# Patient Record
Sex: Female | Born: 1960 | Race: White | Hispanic: No | Marital: Single | Smoking: Current every day smoker
Health system: Southern US, Community
[De-identification: ages and names within clinical notes are randomized; demographics above are authoritative.]

## PROBLEM LIST (undated history)

## (undated) DIAGNOSIS — J449 Chronic obstructive pulmonary disease, unspecified: Secondary | ICD-10-CM

## (undated) DIAGNOSIS — G459 Transient cerebral ischemic attack, unspecified: Secondary | ICD-10-CM

## (undated) DIAGNOSIS — IMO0002 Reserved for concepts with insufficient information to code with codable children: Secondary | ICD-10-CM

## (undated) DIAGNOSIS — I10 Essential (primary) hypertension: Secondary | ICD-10-CM

## (undated) HISTORY — DX: Transient cerebral ischemic attack, unspecified: G45.9

## (undated) HISTORY — PX: BACK SURGERY: SHX140

---

## 2001-12-08 ENCOUNTER — Encounter: Payer: Self-pay | Admitting: Emergency Medicine

## 2001-12-08 ENCOUNTER — Emergency Department (HOSPITAL_COMMUNITY): Admission: EM | Admit: 2001-12-08 | Discharge: 2001-12-08 | Payer: Self-pay | Admitting: Emergency Medicine

## 2002-03-21 ENCOUNTER — Ambulatory Visit (HOSPITAL_COMMUNITY): Admission: RE | Admit: 2002-03-21 | Discharge: 2002-03-21 | Payer: Self-pay | Admitting: Obstetrics and Gynecology

## 2002-03-21 ENCOUNTER — Encounter: Payer: Self-pay | Admitting: Obstetrics and Gynecology

## 2002-04-25 ENCOUNTER — Ambulatory Visit (HOSPITAL_COMMUNITY): Admission: RE | Admit: 2002-04-25 | Discharge: 2002-04-25 | Payer: Self-pay | Admitting: Obstetrics & Gynecology

## 2002-05-12 ENCOUNTER — Emergency Department (HOSPITAL_COMMUNITY): Admission: EM | Admit: 2002-05-12 | Discharge: 2002-05-12 | Payer: Self-pay | Admitting: Emergency Medicine

## 2004-01-15 IMAGING — CR DG CHEST 2V
2 series · 2 of 2 positions shown · non-contrast
Comparison: none

CLINICAL DATA: Chest pain.
 TWO VIEW CHEST
 No prior studies.
 The heart size and mediastinal contours are unremarkable.  The lungs are clear.  The visualized skeleton is unremarkable.  
 IMPRESSION
 No active disease.

[view not recorded (1 of 2)]
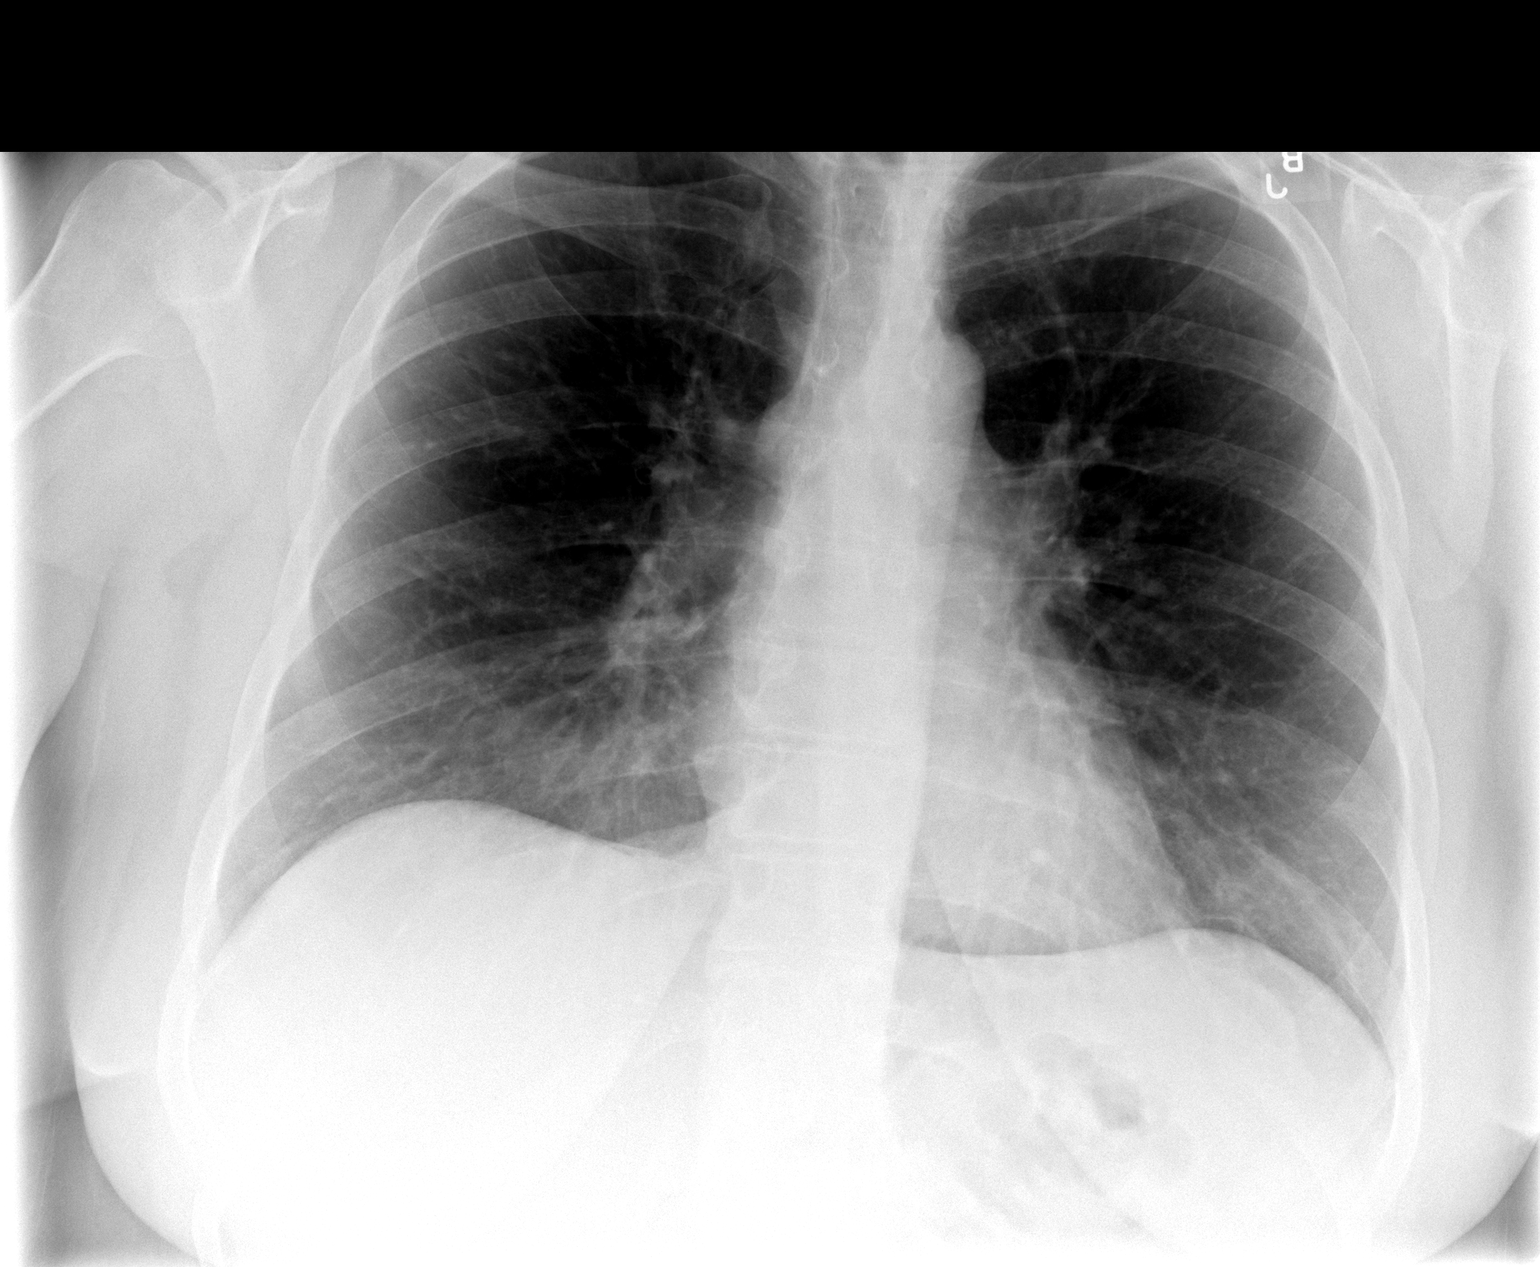

[view not recorded (2 of 2)]
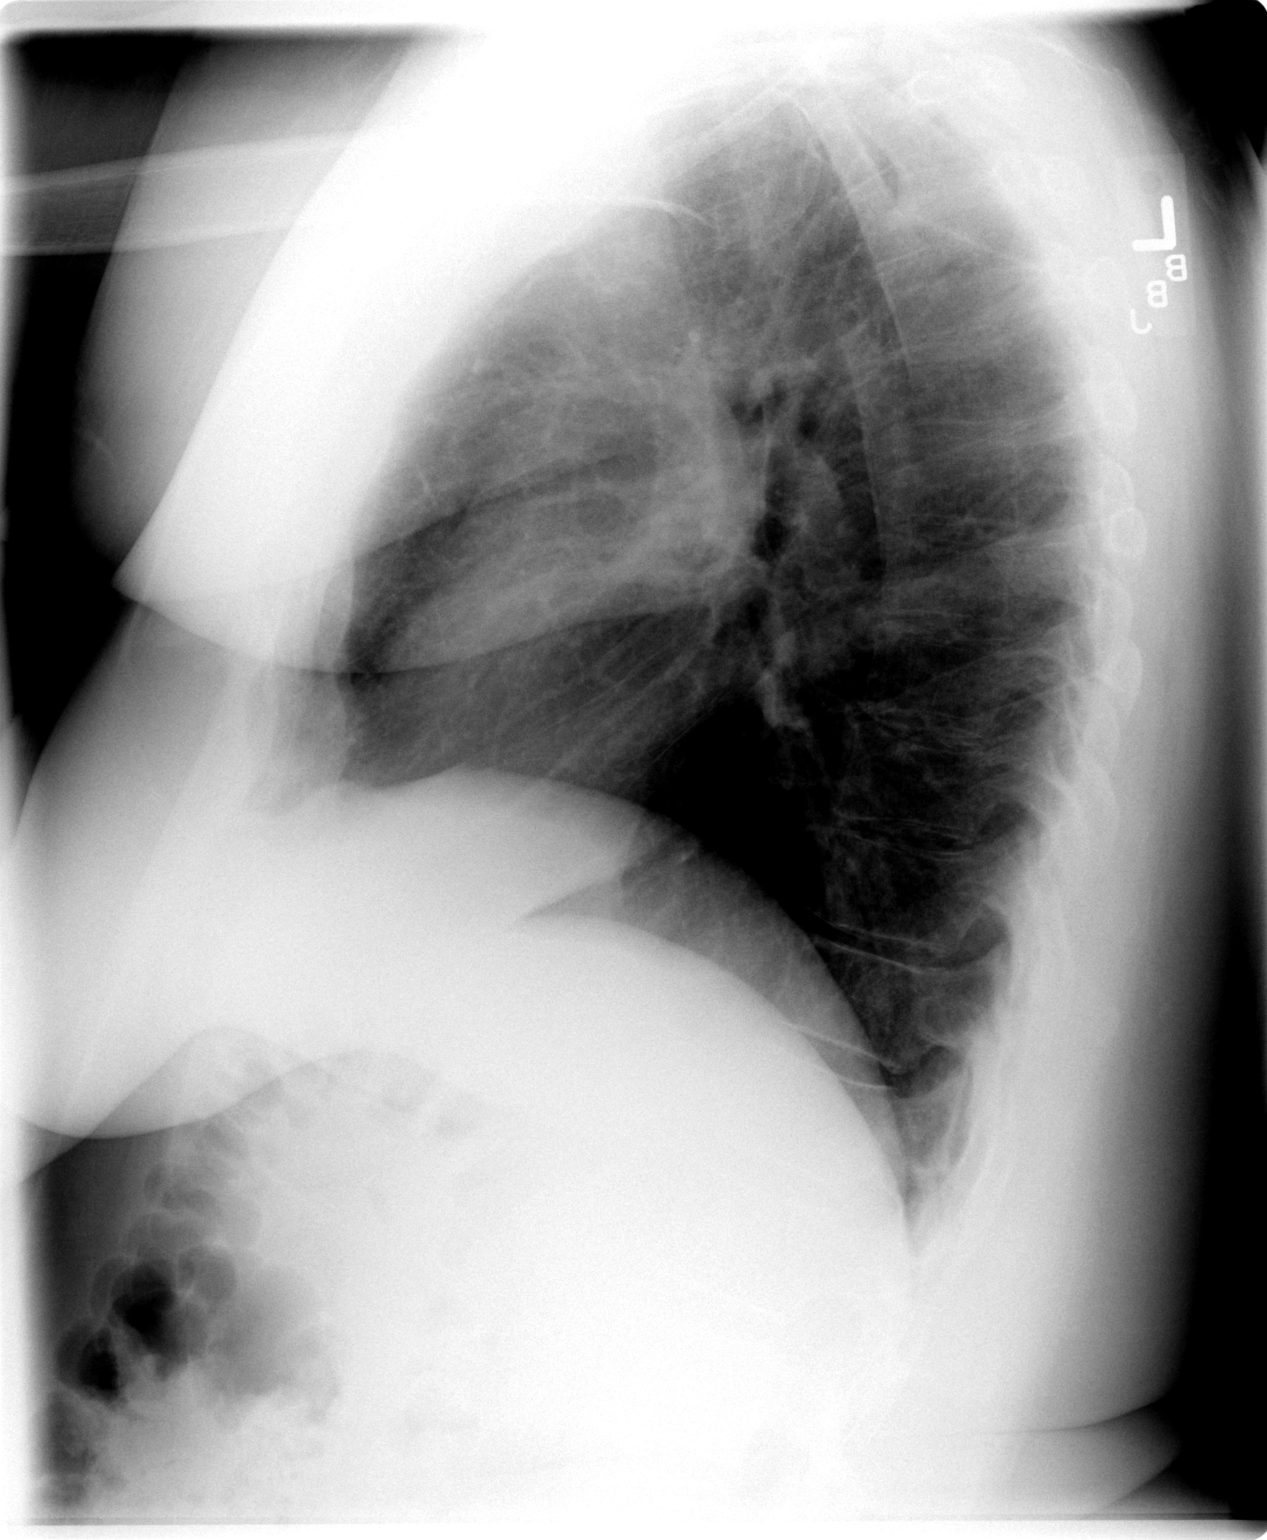

[2 of 2 positions shown; findings below may reference images not displayed]

## 2004-06-19 ENCOUNTER — Emergency Department (HOSPITAL_COMMUNITY): Admission: EM | Admit: 2004-06-19 | Discharge: 2004-06-19 | Payer: Self-pay | Admitting: Emergency Medicine

## 2004-10-06 ENCOUNTER — Ambulatory Visit: Payer: Self-pay | Admitting: Family Medicine

## 2004-11-27 ENCOUNTER — Ambulatory Visit: Payer: Self-pay | Admitting: *Deleted

## 2006-04-21 ENCOUNTER — Ambulatory Visit: Payer: Self-pay | Admitting: Cardiology

## 2006-04-30 ENCOUNTER — Ambulatory Visit: Payer: Self-pay | Admitting: Family Medicine

## 2006-05-07 ENCOUNTER — Ambulatory Visit: Payer: Self-pay | Admitting: Family Medicine

## 2006-06-11 ENCOUNTER — Ambulatory Visit: Payer: Self-pay | Admitting: Family Medicine

## 2007-02-13 ENCOUNTER — Emergency Department (HOSPITAL_COMMUNITY): Admission: EM | Admit: 2007-02-13 | Discharge: 2007-02-13 | Payer: Self-pay | Admitting: Emergency Medicine

## 2007-02-13 IMAGING — CR DG LUMBAR SPINE COMPLETE 4+V
5 series · 5 of 5 positions shown · non-contrast
Comparison: None.

CLINICAL DATA: Low back and left pelvic pain after bending over. 
 DIAGNOSTIC LUMBAR SPINE ? 4 VIEW:

[view not recorded (1 of 5)]
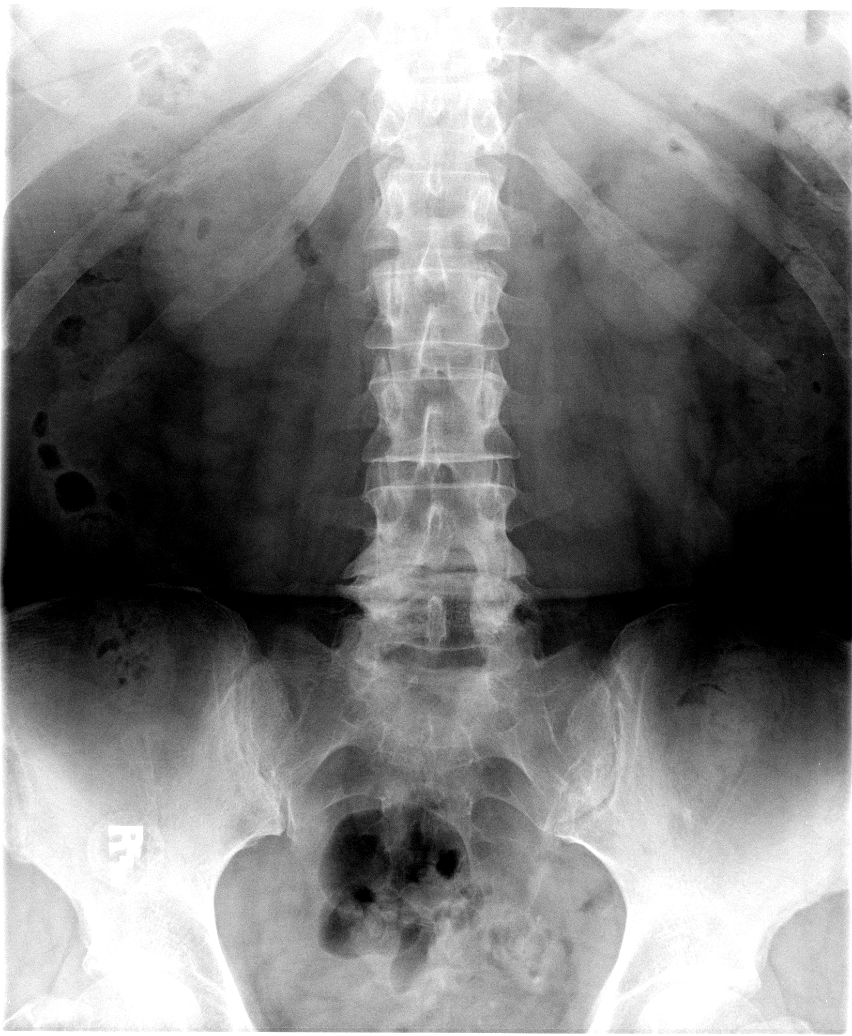

[view not recorded (2 of 5)]
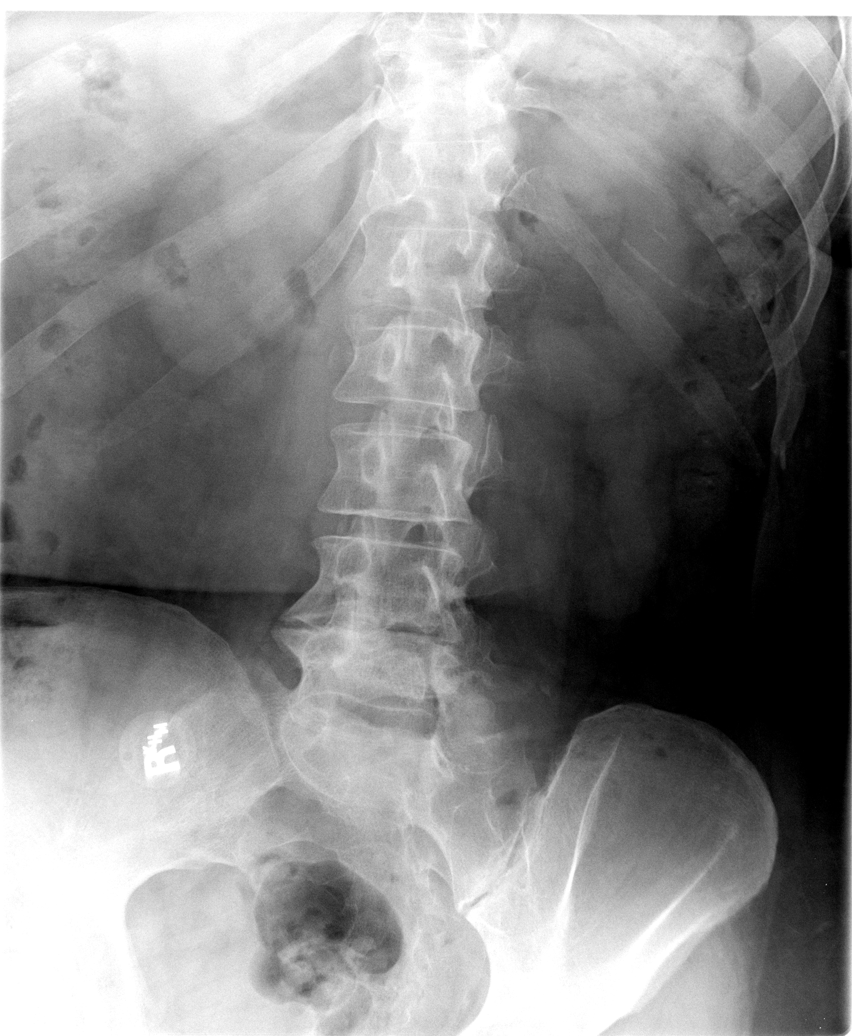

[view not recorded (3 of 5)]
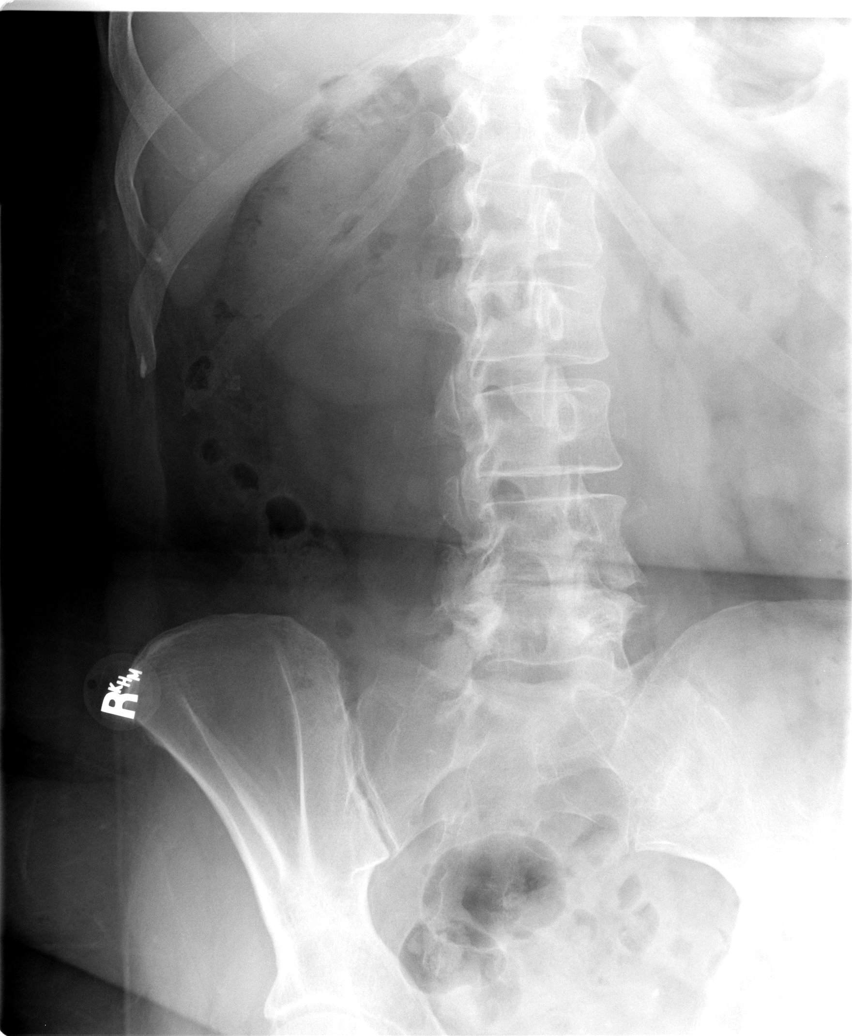

[view not recorded (4 of 5)]
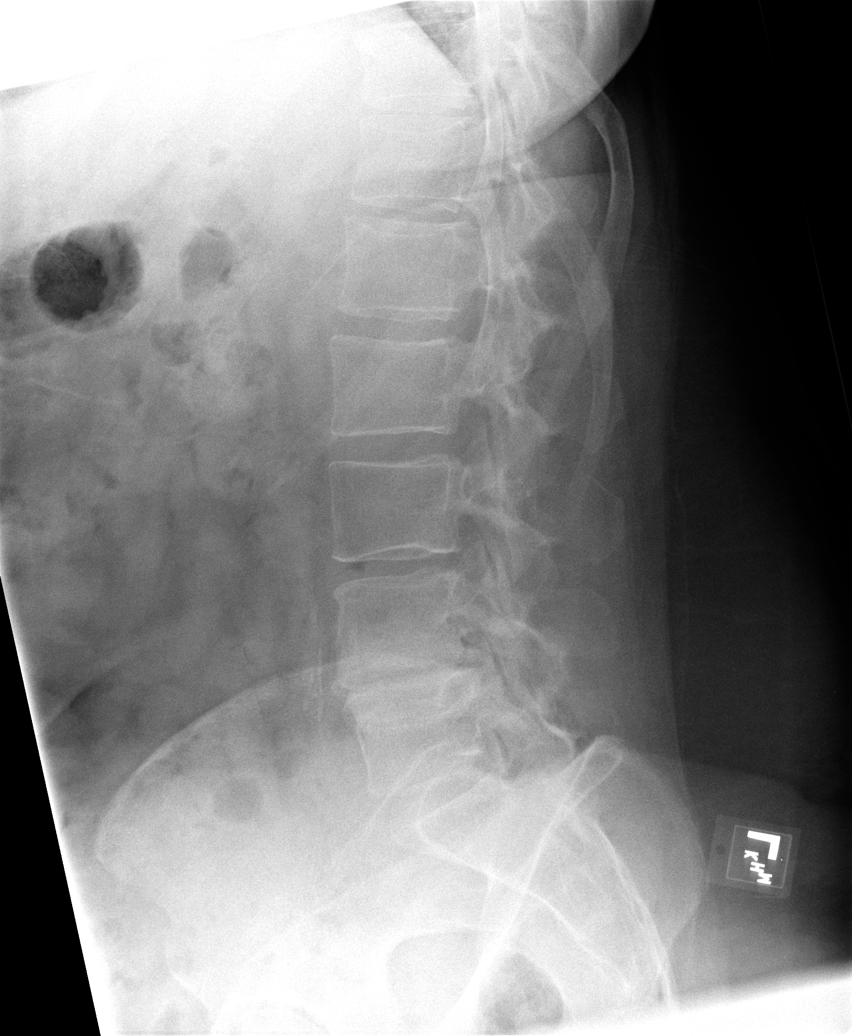

[view not recorded (5 of 5)]
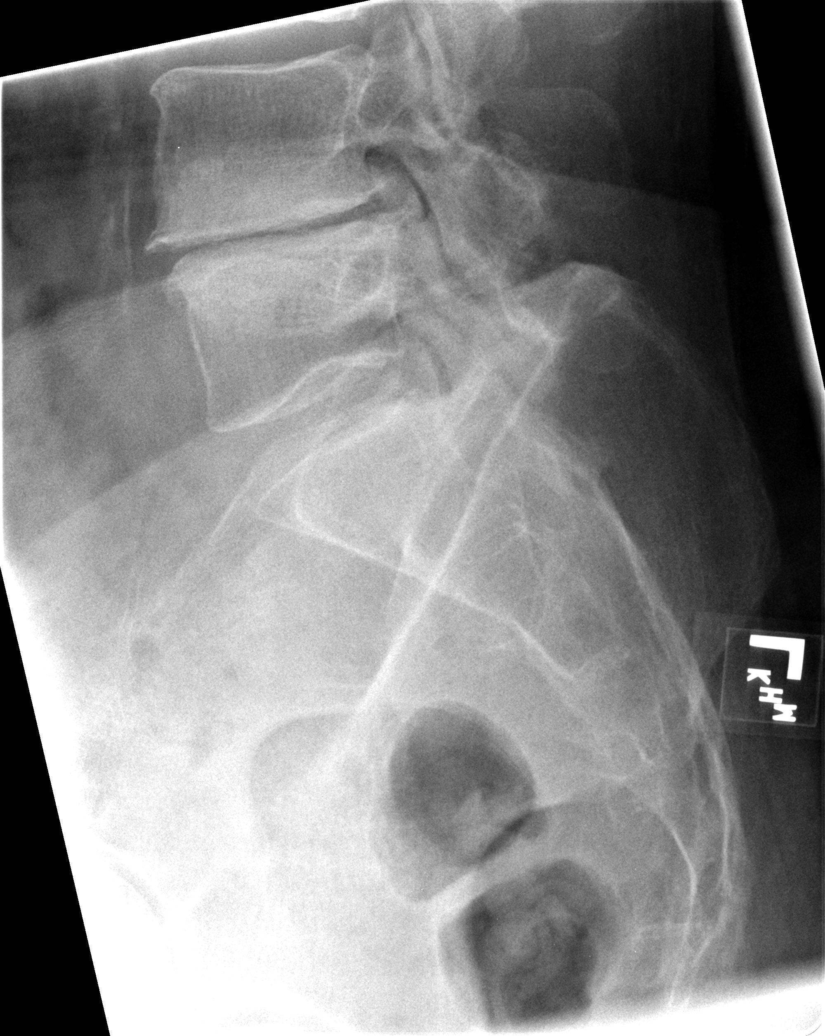

[5 of 5 positions shown; findings below may reference images not displayed]

FINDINGS: There are five lumbar-type vertebral bodies in anatomic alignment.  There is advanced disc space loss at L4-5 with end plate sclerosis.  Mild disc space loss is present at L3-4.  There is no evidence of acute fracture or pars defect.  There are postsurgical changes on the left at L-4 and L-5.
IMPRESSION: Degenerative disc disease at L4-5 with postsurgical changes as described.  No acute findings.

## 2008-10-21 ENCOUNTER — Emergency Department (HOSPITAL_COMMUNITY): Admission: EM | Admit: 2008-10-21 | Discharge: 2008-10-21 | Payer: Self-pay | Admitting: Emergency Medicine

## 2008-10-21 IMAGING — CR DG CHEST 2V
2 series · 2 of 2 positions shown · non-contrast
Comparison: [DATE]

CLINICAL DATA: Shortness of breath and chest pain

CHEST - 2 VIEW

[view not recorded (1 of 2)]
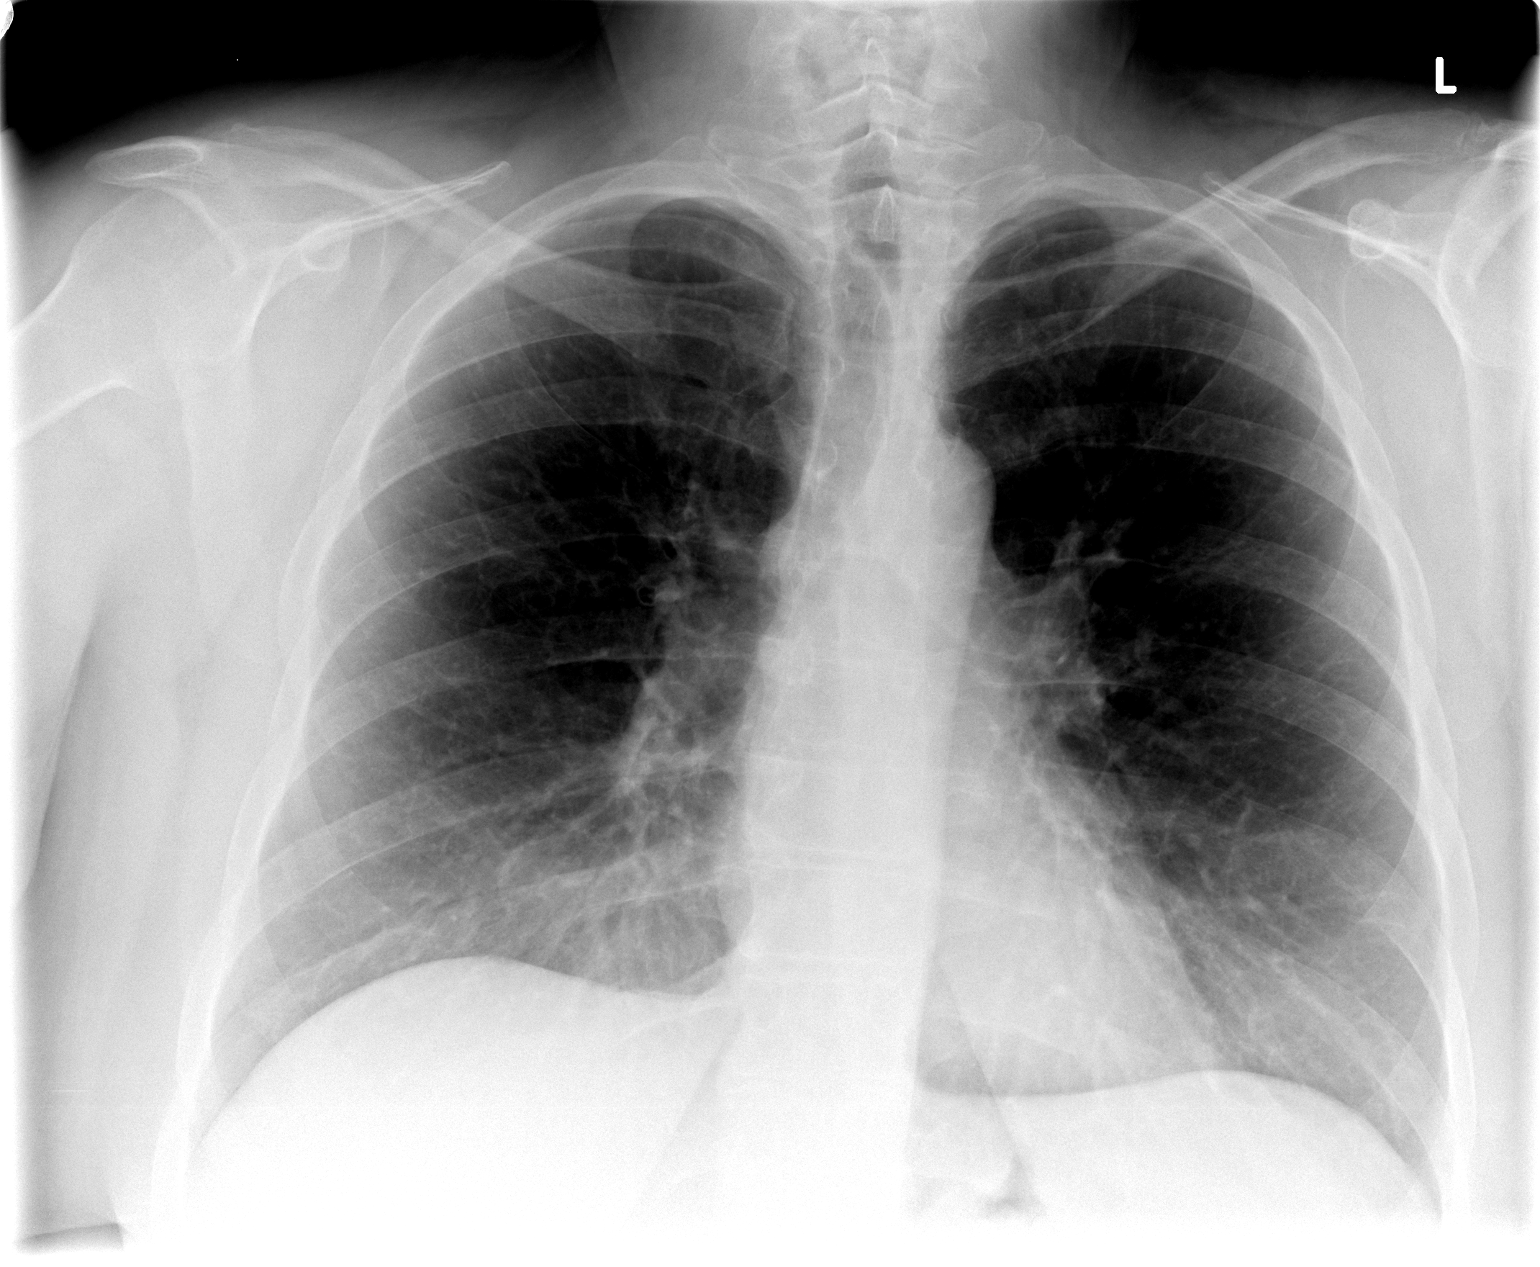

[view not recorded (2 of 2)]
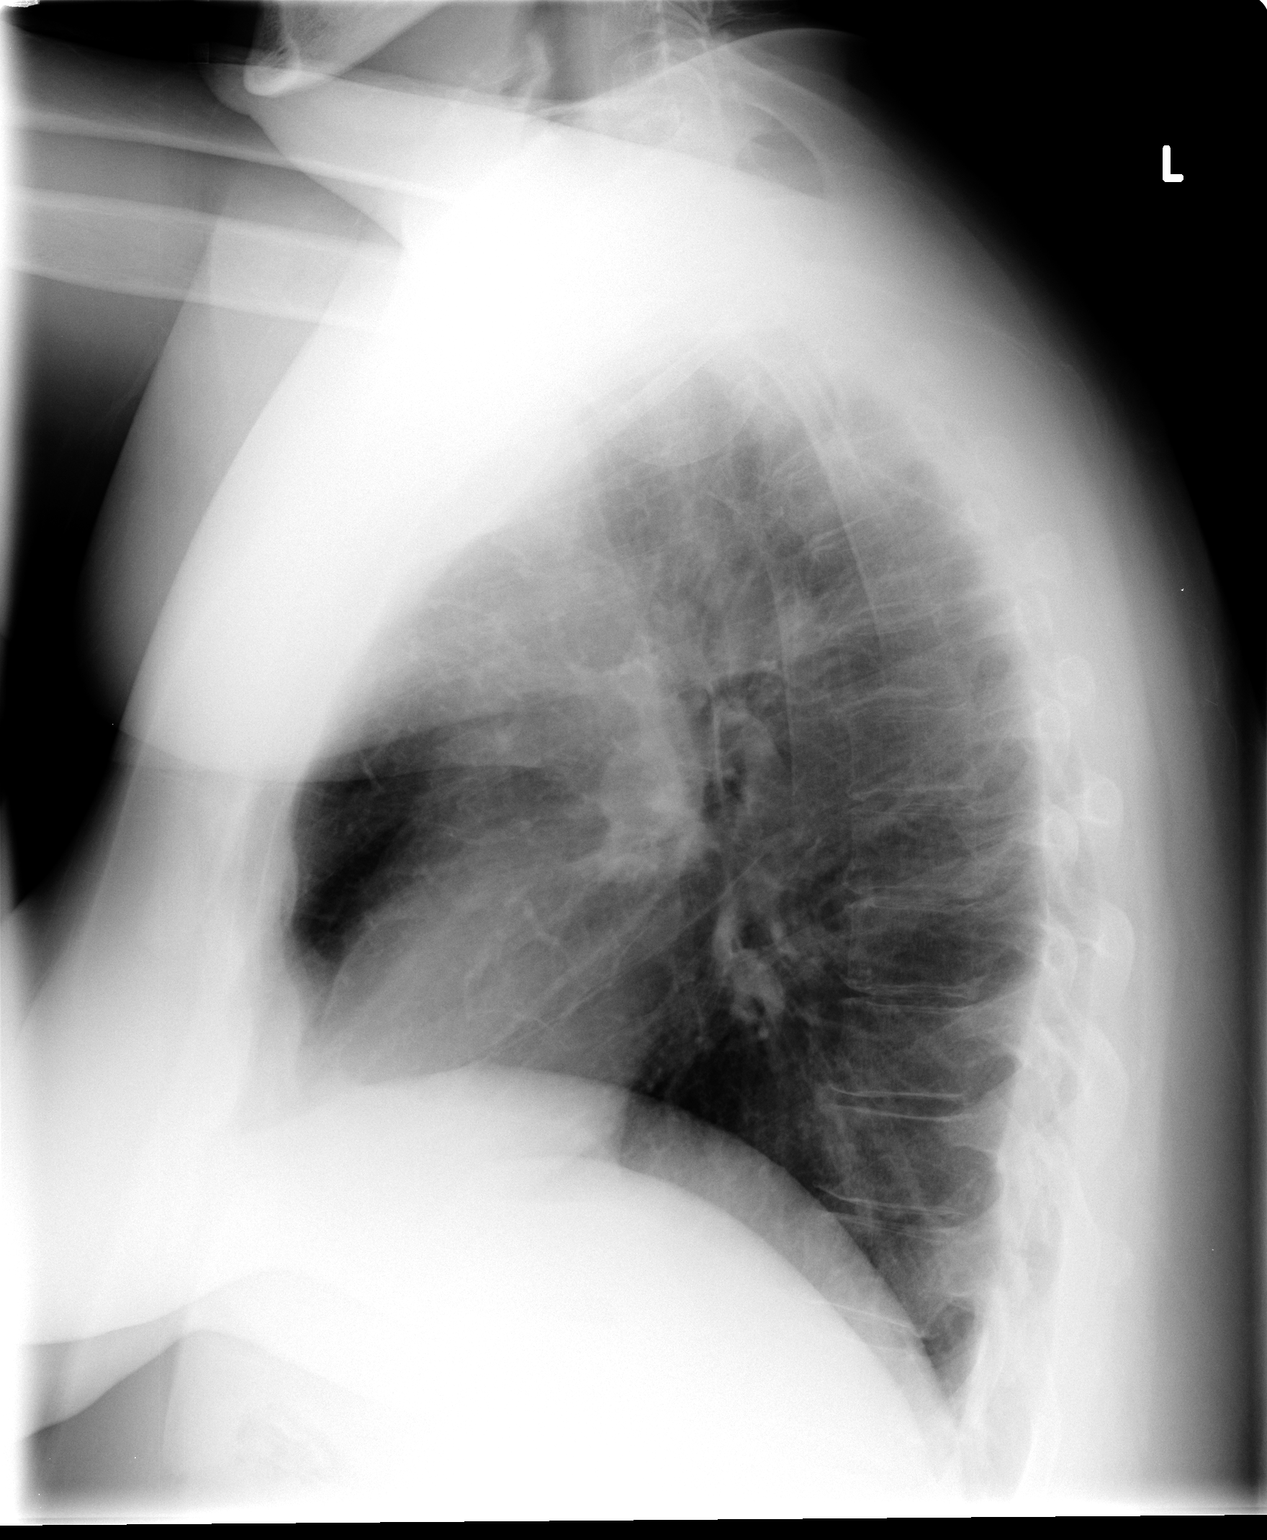

[2 of 2 positions shown; findings below may reference images not displayed]

FINDINGS: The cardiac silhouette, mediastinal and hilar contours
are within normal limits and stable.  There are chronic lung
changes but no acute pulmonary findings.  The bony thorax is
intact.
IMPRESSION: 1.  No acute cardiopulmonary findings.  Stable overall appearance
of the chest since [WA].

## 2009-06-02 ENCOUNTER — Emergency Department (HOSPITAL_COMMUNITY): Admission: EM | Admit: 2009-06-02 | Discharge: 2009-06-02 | Payer: Self-pay | Admitting: Emergency Medicine

## 2009-06-26 ENCOUNTER — Emergency Department (HOSPITAL_COMMUNITY): Admission: EM | Admit: 2009-06-26 | Discharge: 2009-06-26 | Payer: Self-pay | Admitting: Emergency Medicine

## 2009-06-27 IMAGING — CT CT HEAD W/O CM
1 series · 16 of 30 positions shown, 20 images · non-contrast
Comparison: Report of [DATE]

CLINICAL DATA: Dizziness.  Left sided headache.  Weakness and
numbness over last 2-3 weeks.

CT HEAD WITHOUT CONTRAST
TECHNIQUE: Contiguous axial images were obtained from the base of
the skull through the vertex without contrast.

[Series 2: headseq 4.8 h37s · axial · 0.43mm/px · z∈[+127,+262]mm · 16 of 30 slices shown, 20 images]
[im 2/30  brain]
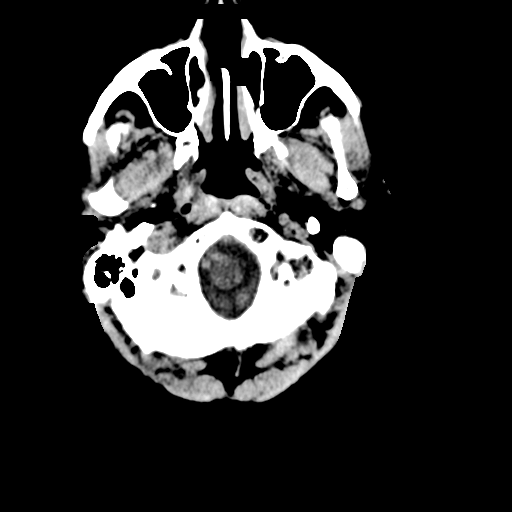
[im 2/30  bone]
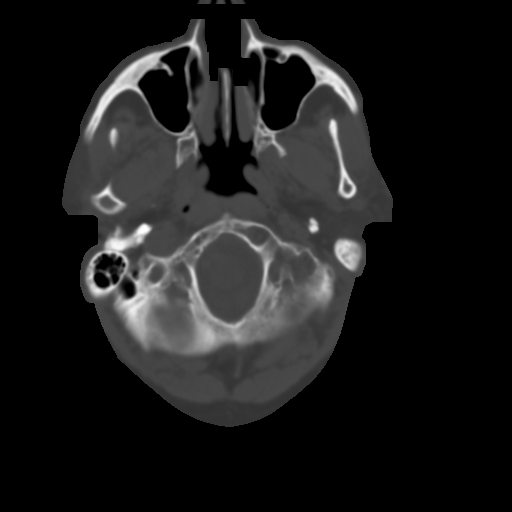
[im 4/30  brain]
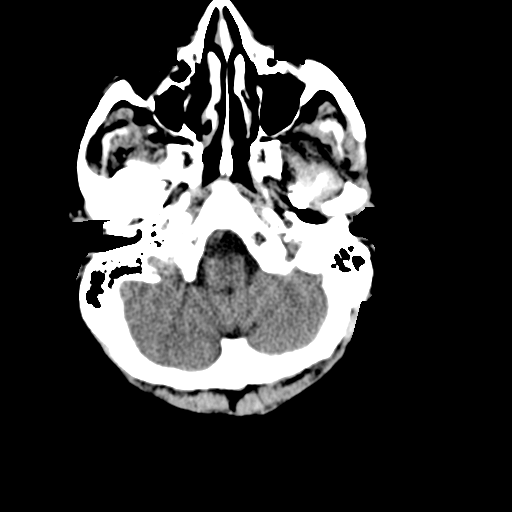
[im 6/30  brain]
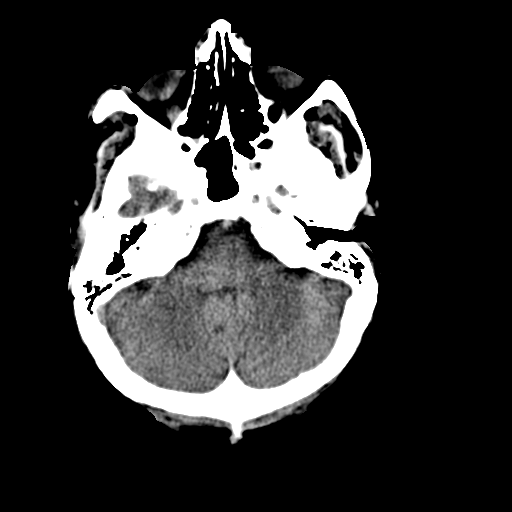
[im 8/30  brain]
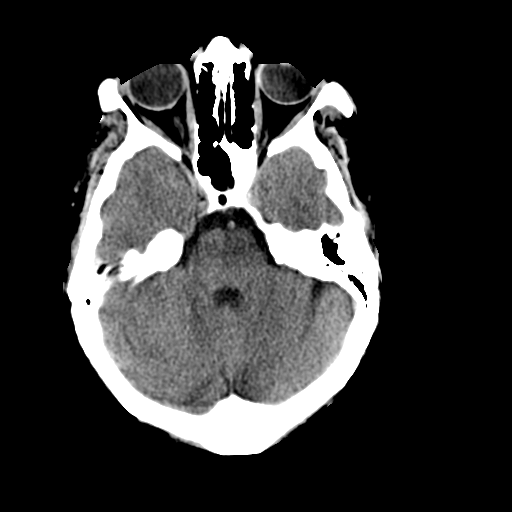
[im 9/30  brain]
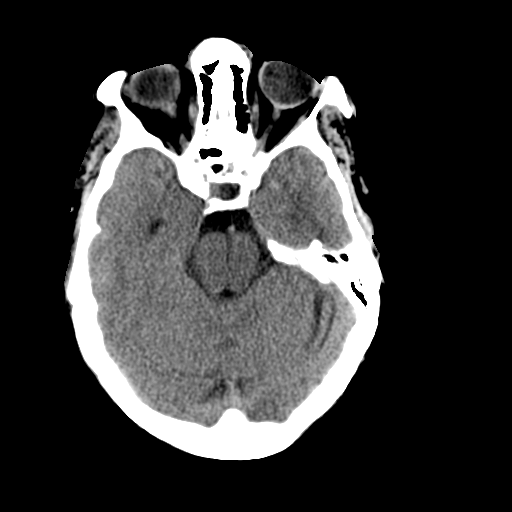
[im 9/30  bone]
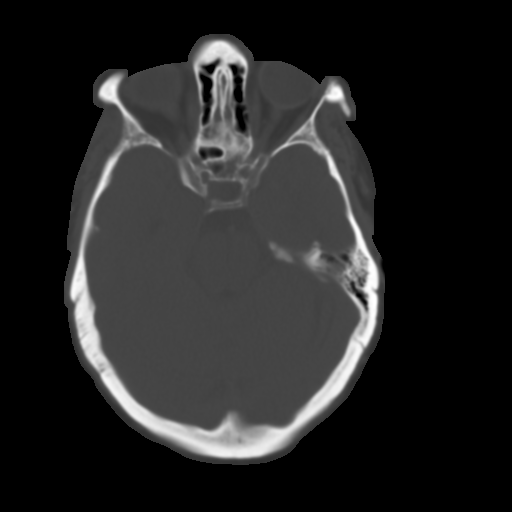
[im 11/30  brain]
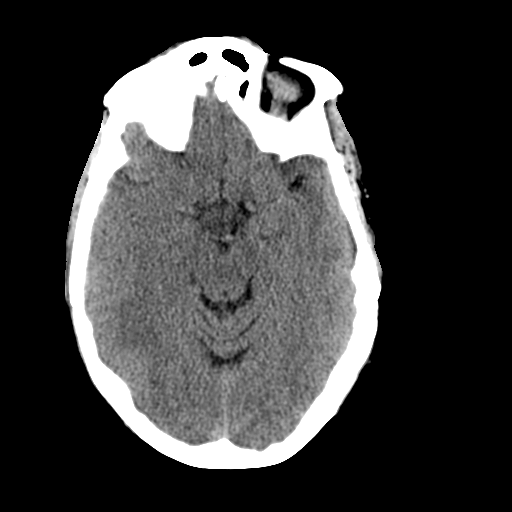
[im 13/30  brain]
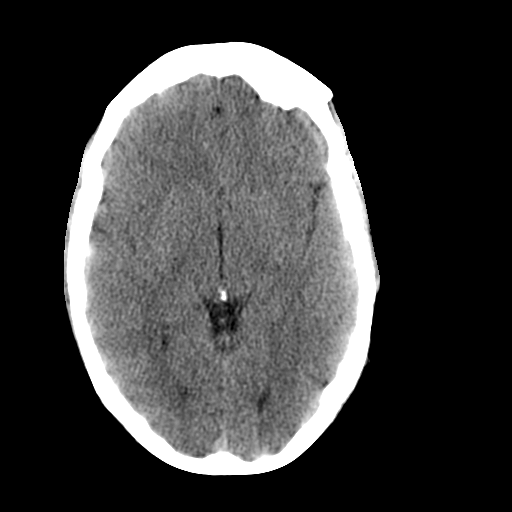
[im 15/30  brain]
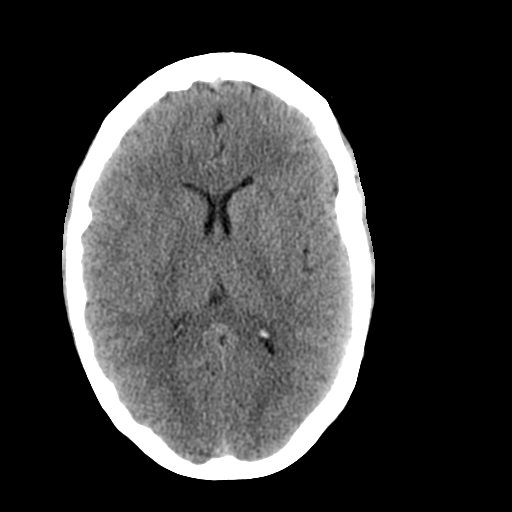
[im 16/30  brain]
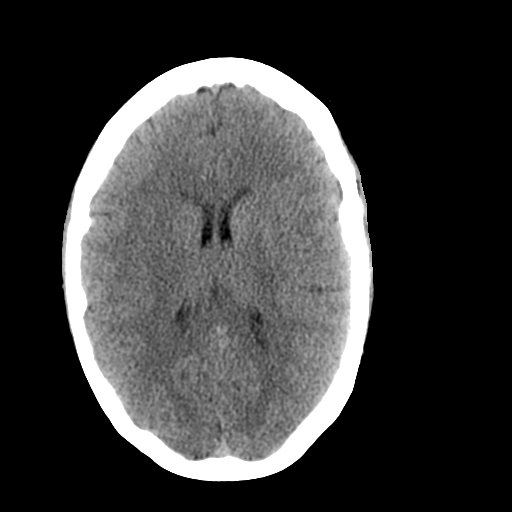
[im 16/30  bone]
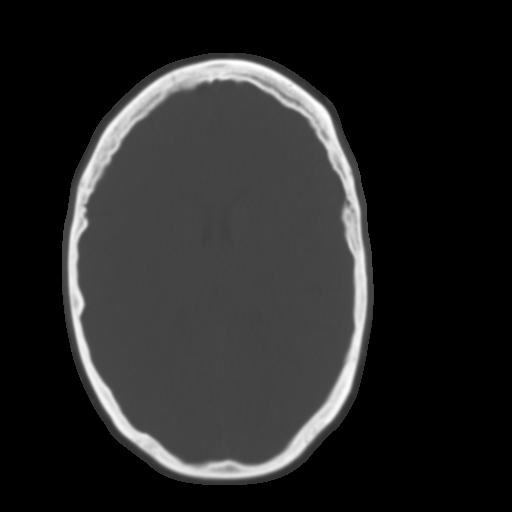
[im 18/30  brain]
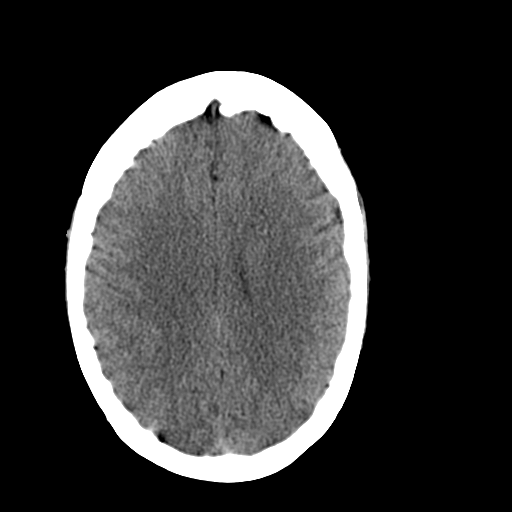
[im 20/30  brain]
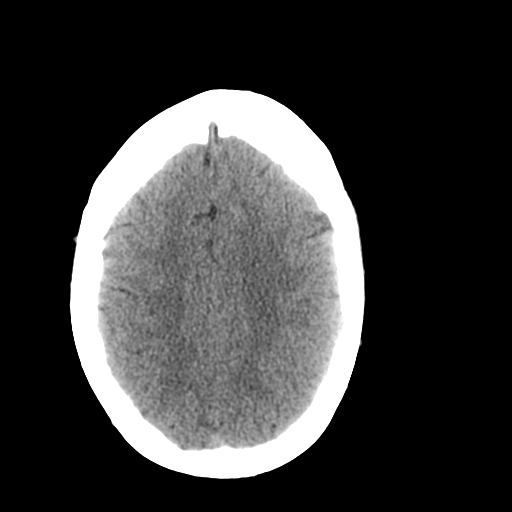
[im 22/30  brain]
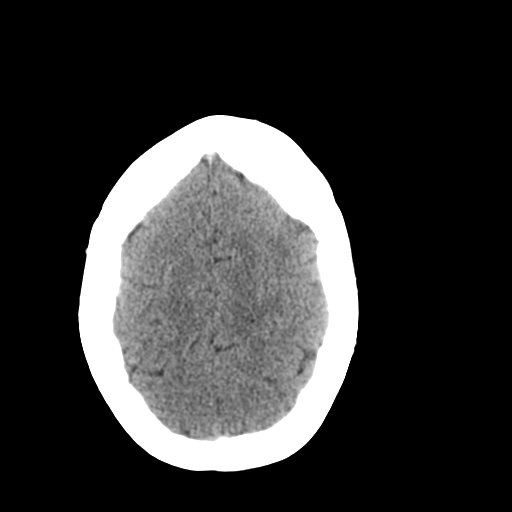
[im 23/30  brain]
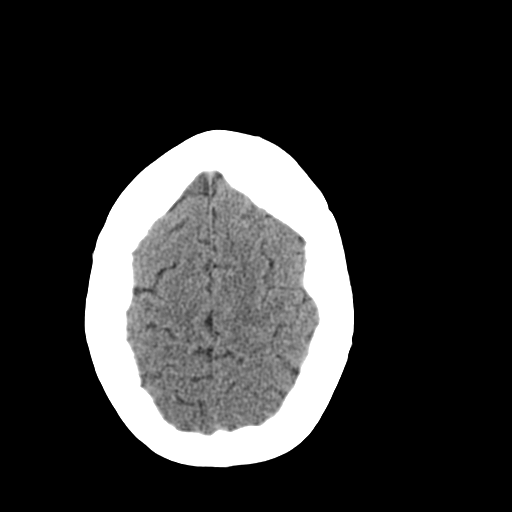
[im 23/30  bone]
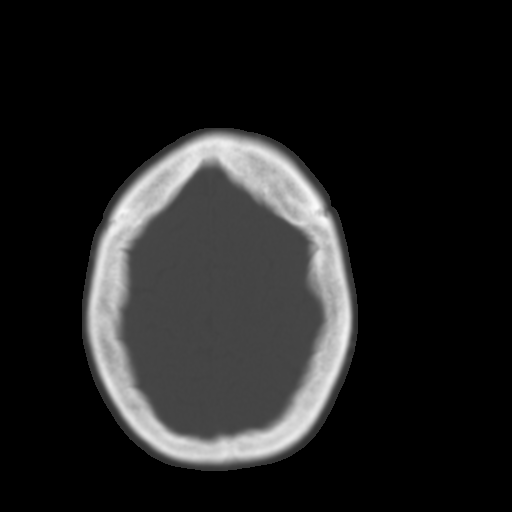
[im 25/30  brain]
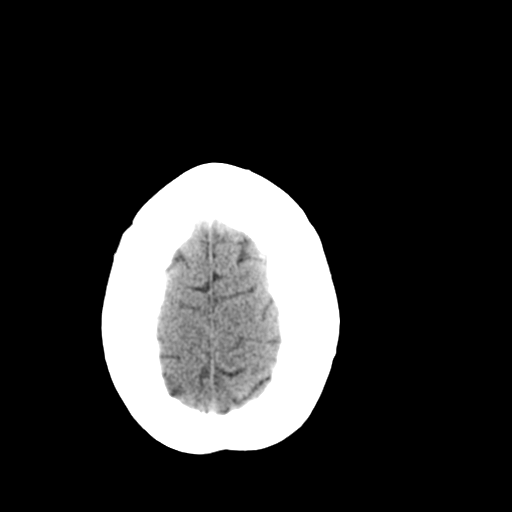
[im 27/30  brain]
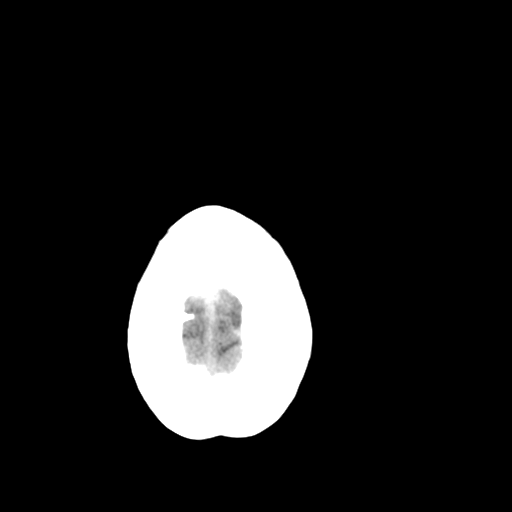
[im 29/30  brain]
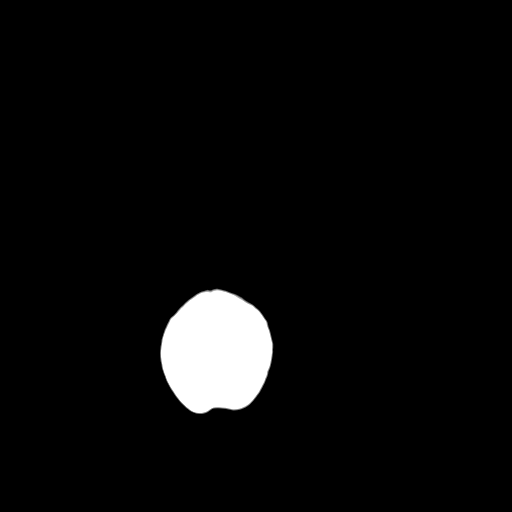

[16 of 30 positions shown; findings below may reference images not displayed]

FINDINGS: Bone windows demonstrate clear paranasal sinuses and
mastoid air cells.

Soft tissue windows demonstrate likely artifactual hyperdensity
within the left temporal region on image 9. No  mass lesion,
hemorrhage, hydrocephalus, acute infarct, intra-axial, or extra-
axial fluid collection.
IMPRESSION: No acute intracranial abnormality.

## 2010-04-07 ENCOUNTER — Emergency Department (HOSPITAL_COMMUNITY): Admission: EM | Admit: 2010-04-07 | Discharge: 2010-04-07 | Payer: Self-pay | Admitting: Emergency Medicine

## 2011-02-06 ENCOUNTER — Emergency Department (HOSPITAL_COMMUNITY): Payer: Medicare Other

## 2011-02-06 ENCOUNTER — Emergency Department (HOSPITAL_COMMUNITY)
Admission: EM | Admit: 2011-02-06 | Discharge: 2011-02-06 | Disposition: A | Discharge status: Home / Self Care | Payer: Medicare Other | Attending: Emergency Medicine | Admitting: Emergency Medicine

## 2011-02-06 DIAGNOSIS — M79609 Pain in unspecified limb: Secondary | ICD-10-CM | POA: Insufficient documentation

## 2011-02-06 DIAGNOSIS — R51 Headache: Secondary | ICD-10-CM | POA: Insufficient documentation

## 2011-02-06 LAB — POCT I-STAT, CHEM 8
BUN: 9 mg/dL (ref 6–23)
Calcium, Ion: 1.17 mmol/L (ref 1.12–1.32)
Chloride: 104 mEq/L (ref 96–112)
Glucose, Bld: 85 mg/dL (ref 70–99)
HCT: 44 % (ref 36.0–46.0)
Potassium: 4 mEq/L (ref 3.5–5.1)

## 2011-02-06 LAB — POCT CARDIAC MARKERS: Troponin i, poc: <0.05 ng/mL (ref 0.00–0.09)

## 2011-02-06 IMAGING — CR DG CHEST 1V PORT
1 series · 1 of 1 positions shown · non-contrast
Comparison: [DATE]

CLINICAL DATA: Chest and arm pain

PORTABLE CHEST - 1 VIEW

[view not recorded]
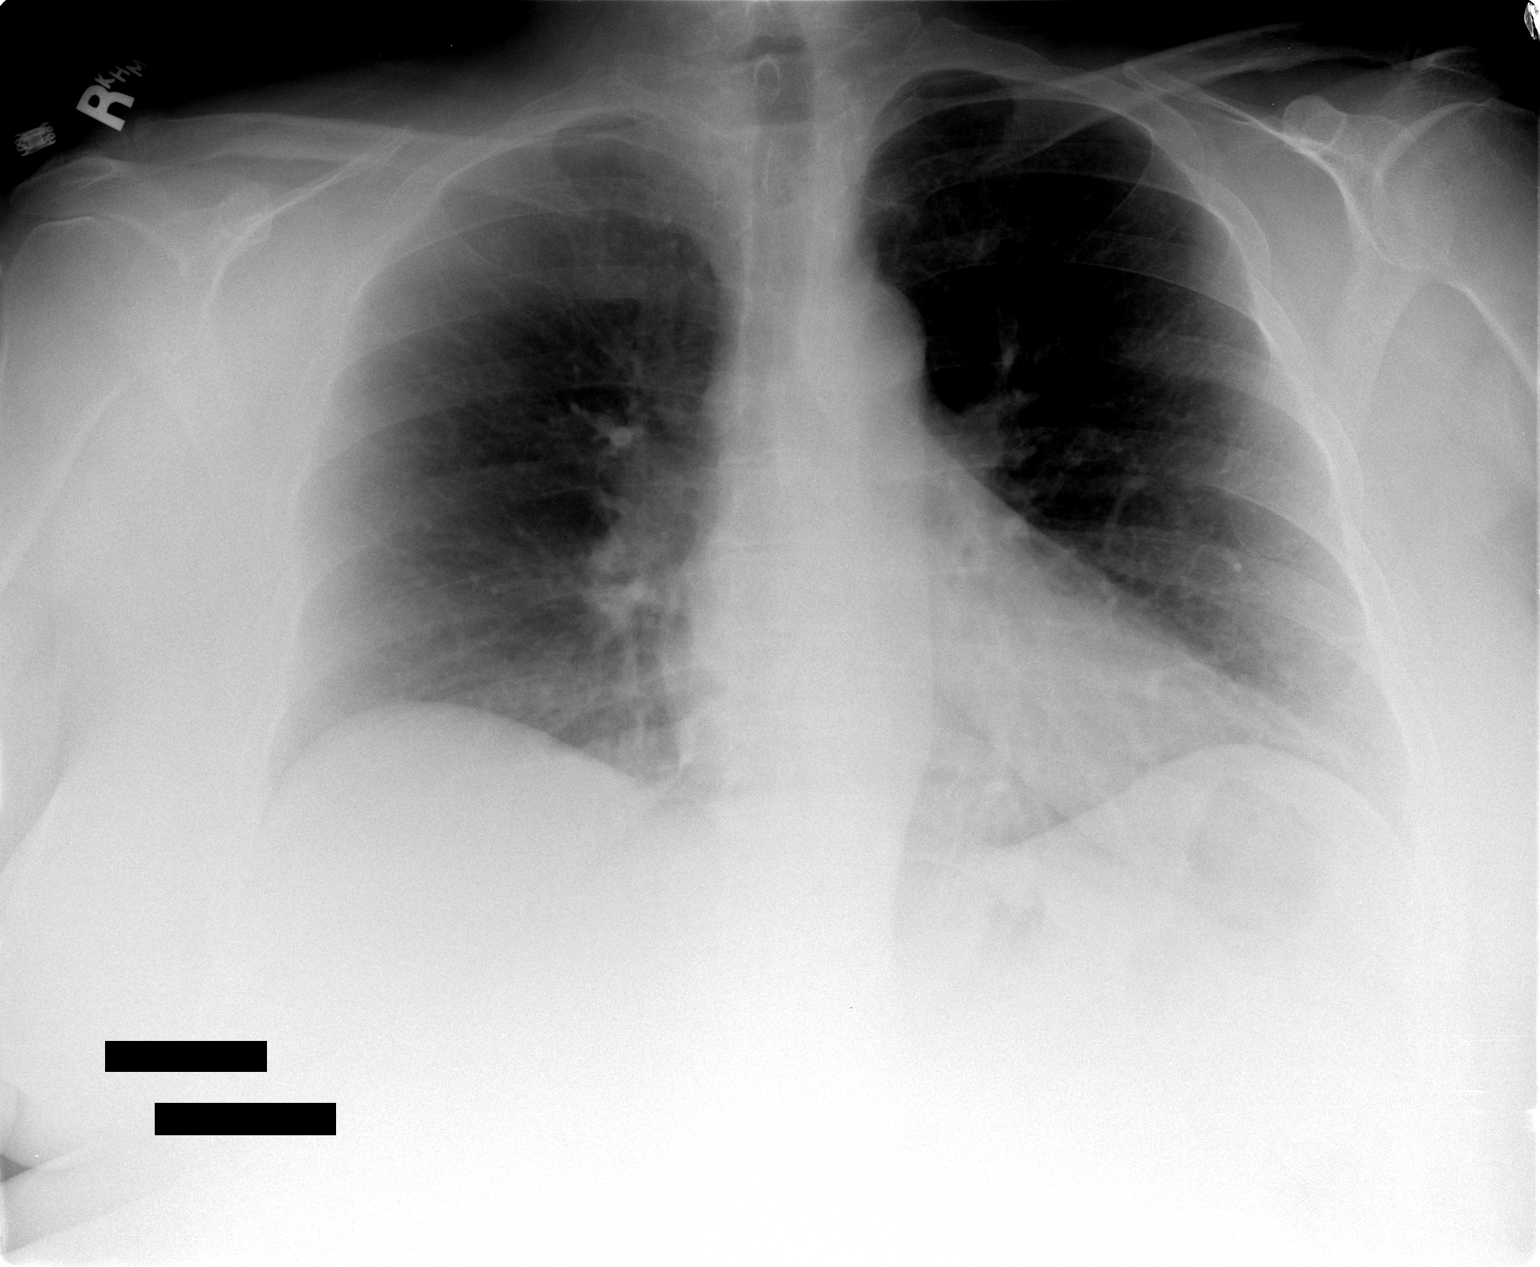

[1 of 1 positions shown; findings below may reference images not displayed]

FINDINGS: Heart and mediastinal contours normal.  Lungs clear.
Osseous structures intact in one-view.
IMPRESSION: No active disease in one-view.

## 2011-02-24 LAB — BASIC METABOLIC PANEL
CO2: 25 mEq/L (ref 19–32)
Calcium: 9.2 mg/dL (ref 8.4–10.5)
Creatinine, Ser: 0.85 mg/dL (ref 0.4–1.2)
GFR calc Af Amer: >60 mL/min (ref >60)
GFR calc non Af Amer: >60 mL/min (ref >60)
Sodium: 135 mEq/L (ref 135–145)

## 2011-02-24 LAB — URINALYSIS, ROUTINE W REFLEX MICROSCOPIC
Glucose, UA: NEGATIVE mg/dL
Ketones, ur: NEGATIVE mg/dL
Leukocytes, UA: NEGATIVE
Nitrite: NEGATIVE
Specific Gravity, Urine: 1.01 (ref 1.005–1.030)
pH: 6.5 (ref 5.0–8.0)

## 2011-02-24 LAB — DIFFERENTIAL
Basophils Relative: 1 % (ref 0–1)
Lymphocytes Relative: 25 % (ref 12–46)
Lymphs Abs: 1.1 10*3/uL (ref 0.7–4.0)
Monocytes Absolute: 0.4 10*3/uL (ref 0.1–1.0)
Monocytes Relative: 9 % (ref 3–12)
Neutro Abs: 2.6 10*3/uL (ref 1.7–7.7)
Neutrophils Relative %: 60 % (ref 43–77)

## 2011-02-24 LAB — WET PREP, GENITAL
Trich, Wet Prep: NONE SEEN
Yeast Wet Prep HPF POC: NONE SEEN

## 2011-02-24 LAB — GC/CHLAMYDIA PROBE AMP, GENITAL
Chlamydia, DNA Probe: NEGATIVE
GC Probe Amp, Genital: NEGATIVE

## 2011-02-24 LAB — CBC
Hemoglobin: 13.9 g/dL (ref 12.0–15.0)
RBC: 4.12 MIL/uL (ref 3.87–5.11)
WBC: 4.3 10*3/uL (ref 4.0–10.5)

## 2011-02-24 LAB — URINE MICROSCOPIC-ADD ON

## 2011-03-15 LAB — CBC
HCT: 43.4 % (ref 36.0–46.0)
Hemoglobin: 15.2 g/dL — ABNORMAL HIGH (ref 12.0–15.0)
MCHC: 35.1 g/dL (ref 30.0–36.0)
Platelets: 196 10*3/uL (ref 150–400)
RDW: 13.1 % (ref 11.5–15.5)

## 2011-03-15 LAB — URINALYSIS, ROUTINE W REFLEX MICROSCOPIC
Bilirubin Urine: NEGATIVE
Hgb urine dipstick: NEGATIVE
Nitrite: NEGATIVE
Specific Gravity, Urine: >1.03 — ABNORMAL HIGH (ref 1.005–1.030)
Urobilinogen, UA: 0.2 mg/dL (ref 0.0–1.0)

## 2011-03-15 LAB — BASIC METABOLIC PANEL WITH GFR
BUN: 11 mg/dL (ref 6–23)
CO2: 29 meq/L (ref 19–32)
Calcium: 9.7 mg/dL (ref 8.4–10.5)
Chloride: 107 meq/L (ref 96–112)
Creatinine, Ser: 1.08 mg/dL (ref 0.4–1.2)
GFR calc Af Amer: >60 mL/min (ref >60)
GFR calc non Af Amer: 54 mL/min — ABNORMAL LOW (ref >60)
Glucose, Bld: 91 mg/dL (ref 70–99)
Potassium: 4.4 meq/L (ref 3.5–5.1)
Sodium: 140 meq/L (ref 135–145)

## 2011-03-15 LAB — DIFFERENTIAL
Basophils Absolute: 0.1 K/uL (ref 0.0–0.1)
Basophils Relative: 1 % (ref 0–1)
Eosinophils Absolute: 0.2 K/uL (ref 0.0–0.7)
Eosinophils Relative: 4 % (ref 0–5)
Lymphocytes Relative: 25 % (ref 12–46)
Lymphs Abs: 1.5 K/uL (ref 0.7–4.0)
Monocytes Absolute: 0.5 K/uL (ref 0.1–1.0)
Monocytes Relative: 8 % (ref 3–12)
Neutro Abs: 4 K/uL (ref 1.7–7.7)
Neutrophils Relative %: 63 % (ref 43–77)

## 2011-04-24 NOTE — Op Note (Signed)
[facility]  Patient:    Felicia Weeks, Felicia Weeks Visit Number: [id number] MRN: [id number]          Service Type: DSU Location: DAY Attending Physician:  Lazaro Arms Dictated by:   Duane Lope, M.D. Proc. Date: 04/25/02 Admit Date:  04/25/2002                             Operative Report  PREOPERATIVE DIAGNOSIS:  1. Parous female desires permanent sterilization. 2. Morbid obesity.  POSTOPERATIVE DIAGNOSIS:  1. Parous female desires permanent sterilization. 2. Morbid obesity.  OPERATION/PROCEDURE:  Laparoscopic bilateral tubal ligation using electrocautery.  SURGEON: Duane Lope, M.D.  ANESTHESIA:  General endotracheal.  FINDINGS:  The patient had normal uterus, tubes, and ovaries.  She had a normal intraperitoneal cavity, and normal upper abdomen.  DESCRIPTION OF PROCEDURE:  The patient was taken to the operating room and placed in the supine position where she underwent general endotracheal anesthesia, placed in the dorsal lithotomy position and prepped and draped in the usual sterile fashion.  The vagina was prepped as well as the urethra. She had an in-and-out catheterization done.  Her cervix was grasped with a single-tooth tenaculum and a Hulka tenaculum was placed.  Marcaine 1/2% with 1:200,000 epinephrine was placed in a diamond-like fashion in the skin and subcutaneous tissue down through the fascia, 20 cc total.  An incision was then made in the umbilicus and dissected down to the rectus fascia.  The fascia was identified, and grasped with Kocher clamps.  It was incised.  Peritoneal cavity was then entered and a long sheath was placed into the peritoneal cavity.  I had some difficulty insufflating because of morbid obesity and the size of the incision.  A purse-string suture and another stay suture was placed as well as Vaseline gauze.  The abdomen was then insufflated.  Peritoneal cavity was identified and tubes and ovaries were  identified as well as the uterus. An approximately 3-cm segment was burned to no resistance and beyond in both tubes in the distal and ampullary region of each tube.  There was good hemostasis bilaterally and there was good eschar of the tubal segments bilaterally.  The upper abdomen was identified and found to be normal as was the rest of the peritoneal cavity.  The instruments were removed.  The gas was allowed to escape.  The fascia was closed using #0 Vicryl sutures. There was good return of anatomy.  The defect was completely closed and the skin was closed using skin staples.  The patient tolerated the procedure well. She experienced minimal blood loss, taken to the recovery room in good stable condition.  All counts were correct. Dictated by:   Duane Lope, M.D. Attending Physician:  Lazaro Arms DD:  04/25/02 TD:  04/26/02 Job: [id number]

## 2012-07-05 ENCOUNTER — Emergency Department (HOSPITAL_COMMUNITY)
Admission: EM | Admit: 2012-07-05 | Discharge: 2012-07-05 | Disposition: A | Discharge status: Home / Self Care | Payer: Medicare Other | Attending: Emergency Medicine | Admitting: Emergency Medicine

## 2012-07-05 ENCOUNTER — Emergency Department (HOSPITAL_COMMUNITY): Payer: Medicare Other

## 2012-07-05 ENCOUNTER — Encounter (HOSPITAL_COMMUNITY): Payer: Self-pay | Admitting: Emergency Medicine

## 2012-07-05 DIAGNOSIS — I10 Essential (primary) hypertension: Secondary | ICD-10-CM | POA: Insufficient documentation

## 2012-07-05 DIAGNOSIS — S92919A Unspecified fracture of unspecified toe(s), initial encounter for closed fracture: Secondary | ICD-10-CM | POA: Insufficient documentation

## 2012-07-05 DIAGNOSIS — S92911A Unspecified fracture of right toe(s), initial encounter for closed fracture: Secondary | ICD-10-CM

## 2012-07-05 DIAGNOSIS — IMO0002 Reserved for concepts with insufficient information to code with codable children: Secondary | ICD-10-CM | POA: Insufficient documentation

## 2012-07-05 DIAGNOSIS — W2203XA Walked into furniture, initial encounter: Secondary | ICD-10-CM | POA: Insufficient documentation

## 2012-07-05 DIAGNOSIS — F17200 Nicotine dependence, unspecified, uncomplicated: Secondary | ICD-10-CM | POA: Insufficient documentation

## 2012-07-05 HISTORY — DX: Essential (primary) hypertension: I10

## 2012-07-05 HISTORY — DX: Reserved for concepts with insufficient information to code with codable children: IMO0002

## 2012-07-05 IMAGING — CR DG TOE 5TH 2+V*R*
1 series · 1 of 1 positions shown · non-contrast
Comparison: None.

CLINICAL DATA: Right fifth toe pain and deformity.

RIGHT FIFTH TOE

[view not recorded]
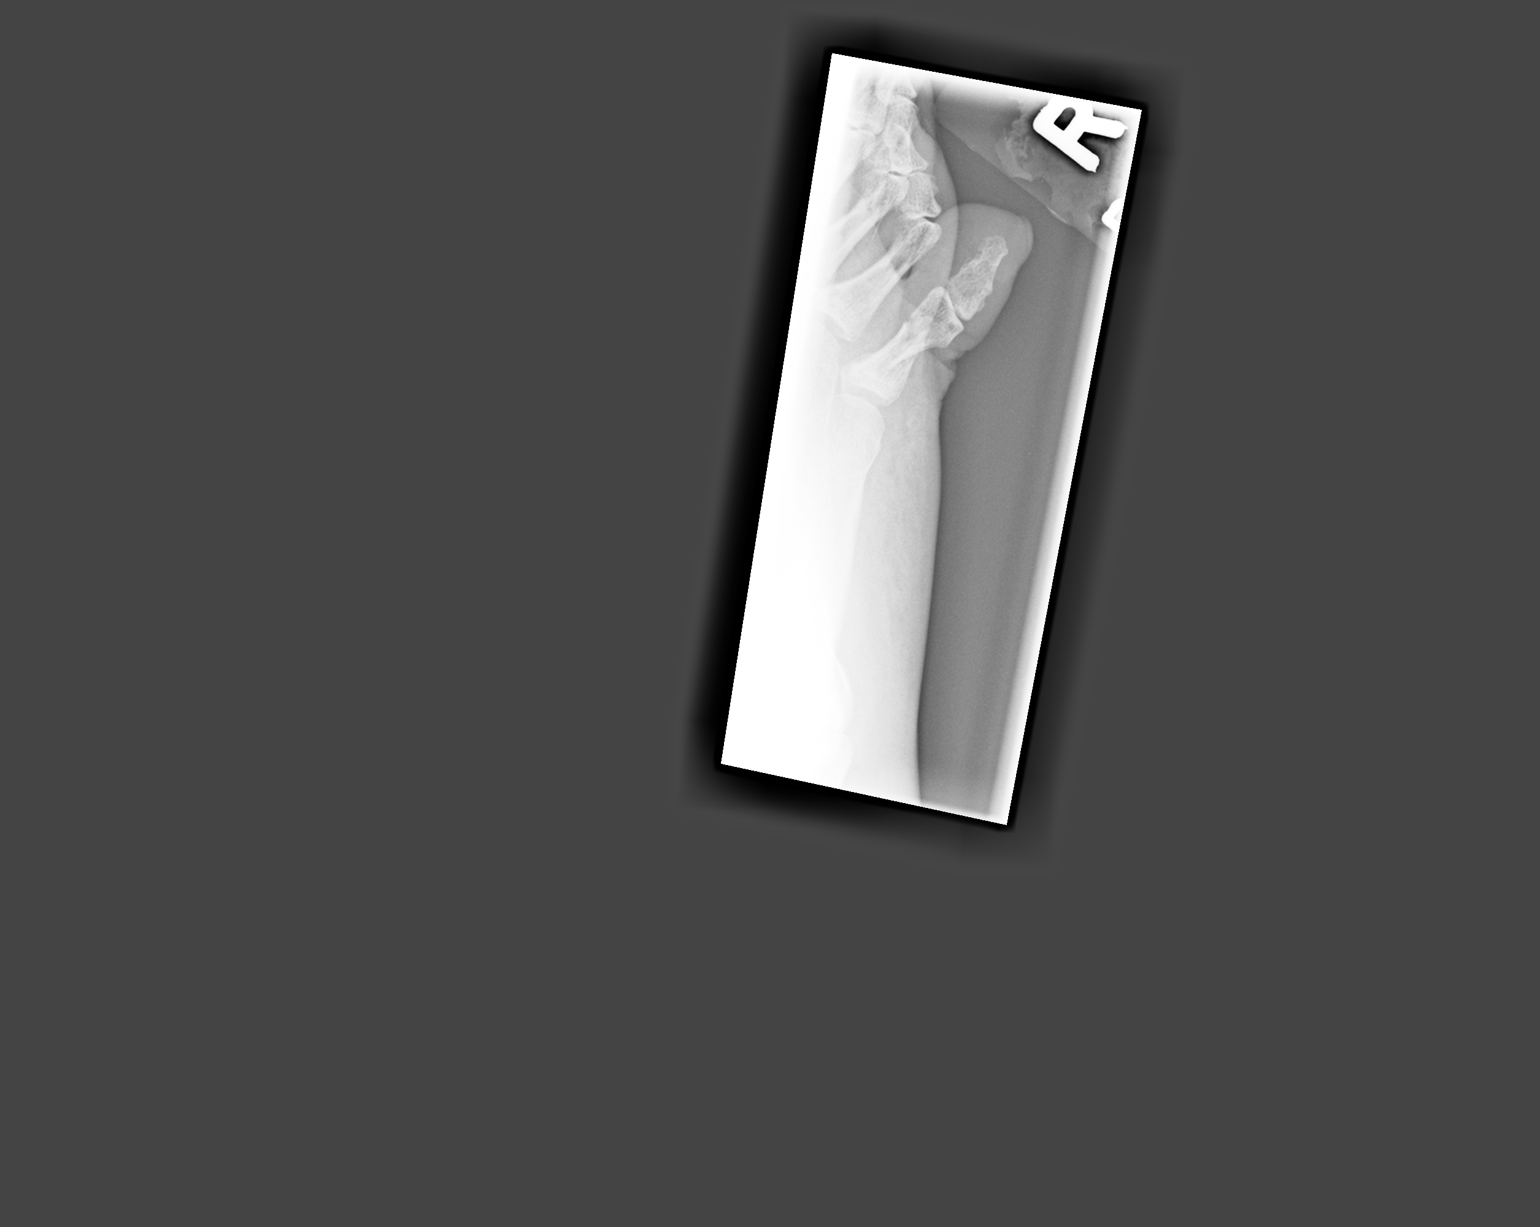

[1 of 1 positions shown; findings below may reference images not displayed]

FINDINGS: There is an oblique fracture through the proximal phalanx
of the fifth digit with lateral displacement and angulation. No
intra-articular extension.  No additional fracture.  No
dislocation.  The fifth middle and distal phalanges are fused.
IMPRESSION: Oblique fracture proximal phalanx fifth digit.

## 2012-07-05 MED ORDER — TRAMADOL HCL 50 MG PO TABS
50.0000 mg | ORAL_TABLET | Freq: Once | ORAL | Status: AC
  Administered 2012-07-05: 50 mg via ORAL
  Filled 2012-07-05: qty 1

## 2012-07-05 MED ORDER — TRAMADOL HCL 50 MG PO TABS: 50.0000 mg | ORAL_TABLET | Freq: Four times a day (QID) | ORAL | Status: AC | PRN

## 2012-07-05 MED ORDER — IBUPROFEN 800 MG PO TABS
800.0000 mg | ORAL_TABLET | Freq: Once | ORAL | Status: AC
  Administered 2012-07-05: 800 mg via ORAL
  Filled 2012-07-05: qty 1

## 2012-07-05 NOTE — ED Notes (Addendum)
R. Miller, PA at bedside  

## 2012-07-05 NOTE — ED Notes (Signed)
Pt hit 5th right toe on recliner today, toe turned out, pt not able to ambulate on toes of right foot

## 2012-07-05 NOTE — ED Provider Notes (Signed)
History     CSN: [id number]  Arrival date & time 07/05/12  2110   First MD Initiated Contact with Patient 07/05/12 2131      Chief Complaint  Patient presents with  . Toe Pain    (Consider location/radiation/quality/duration/timing/severity/associated sxs/prior treatment) HPI Comments: Struck R 5th toe on piece of furniture   Patient is a 51 y.o. female presenting with toe pain. The history is provided by the patient.  Toe Pain This is a new problem. Episode onset: several hrs ago. The problem occurs constantly. The problem has been gradually improving. The symptoms are aggravated by walking. She has tried nothing for the symptoms.    Past Medical History  Diagnosis Date  . DDD (degenerative disc disease)   . Hypertension     Past Surgical History  Procedure Date  . Back surgery     History reviewed. No pertinent family history.  History  Substance Use Topics  . Smoking status: Current Everyday Smoker  . Smokeless tobacco: Not on file  . Alcohol Use: No    OB History    Grav Para Term Preterm Abortions TAB SAB Ect Mult Living                  Review of Systems  Musculoskeletal:       Toe injury   All other systems reviewed and are negative.    Allergies  Benadryl and Vicodin  Home Medications   Current Outpatient Rx  Name Route Sig Dispense Refill  . METOPROLOL TARTRATE 50 MG PO TABS Oral Take 50 mg by mouth 2 (two) times daily.    Marland Kitchen NAPROXEN SODIUM 220 MG PO CAPS Oral Take 440 mg by mouth as needed.    Marland Kitchen TRAMADOL HCL 50 MG PO TABS Oral Take 1 tablet (50 mg total) by mouth every 6 (six) hours as needed for pain. 20 tablet 0    BP 156/68  Pulse 77  Temp 97.6 F (36.4 C) (Oral)  Resp 16  Ht 5\' 6"  (1.676 m)  Wt 280 lb (127.007 kg)  BMI 45.19 kg/m2  SpO2 100%  Physical Exam  Nursing note and vitals reviewed. Constitutional: She is oriented to person, place, and time. She appears well-developed and well-nourished. No distress.  HENT:    Head: Normocephalic and atraumatic.  Eyes: EOM are normal.  Neck: Normal range of motion.  Cardiovascular: Normal rate, regular rhythm and normal heart sounds.   Pulmonary/Chest: Effort normal and breath sounds normal.  Abdominal: Soft. She exhibits no distension. There is no tenderness.  Musculoskeletal: She exhibits tenderness.       Feet:  Neurological: She is alert and oriented to person, place, and time.  Skin: Skin is warm and dry.  Psychiatric: She has a normal mood and affect. Judgment normal.    ED Course  Procedures (including critical care time)  Labs Reviewed - No data to display Dg Toe 5th Right  07/05/2012  *RADIOLOGY REPORT*  Clinical Data: Right fifth toe pain and deformity.  RIGHT FIFTH TOE  Comparison: None.  Findings: There is an oblique fracture through the proximal phalanx of the fifth digit with lateral displacement and angulation. No intra-articular extension.  No additional fracture.  No dislocation.  The fifth middle and distal phalanges are fused.  IMPRESSION: Oblique fracture proximal phalanx fifth digit.  Original Report Authenticated By: Waneta Martins, M.D.     1. Toe fracture, right       MDM  Ic, buddy tape, post op  shoe, elevation Ibuprofen 800 mg TID rx-ultram, 20 F/u with your PCP prn.        Evalina Field, [place] 07/05/12 2224

## 2012-07-05 NOTE — ED Provider Notes (Signed)
Medical screening examination/treatment/procedure(s) were performed by non-physician practitioner and as supervising physician I was immediately available for consultation/collaboration.   Nohelani Benning B. Tanaia Hawkey, MD 07/05/12 2347 

## 2012-07-05 NOTE — ED Notes (Signed)
Patient states "I hit my little toe and think I broke it." Complaining of pain in right 5th toe.

## 2015-06-06 ENCOUNTER — Encounter (HOSPITAL_COMMUNITY): Payer: Self-pay | Admitting: Emergency Medicine

## 2015-06-06 ENCOUNTER — Emergency Department (HOSPITAL_COMMUNITY)
Admission: EM | Admit: 2015-06-06 | Discharge: 2015-06-06 | Disposition: A | Discharge status: Home / Self Care | Payer: Medicare Other | Attending: Emergency Medicine | Admitting: Emergency Medicine

## 2015-06-06 DIAGNOSIS — I10 Essential (primary) hypertension: Secondary | ICD-10-CM | POA: Diagnosis not present

## 2015-06-06 DIAGNOSIS — Z79899 Other long term (current) drug therapy: Secondary | ICD-10-CM | POA: Insufficient documentation

## 2015-06-06 DIAGNOSIS — K5900 Constipation, unspecified: Secondary | ICD-10-CM | POA: Insufficient documentation

## 2015-06-06 DIAGNOSIS — Z720 Tobacco use: Secondary | ICD-10-CM | POA: Insufficient documentation

## 2015-06-06 DIAGNOSIS — K6289 Other specified diseases of anus and rectum: Secondary | ICD-10-CM | POA: Diagnosis present

## 2015-06-06 DIAGNOSIS — K644 Residual hemorrhoidal skin tags: Secondary | ICD-10-CM | POA: Diagnosis not present

## 2015-06-06 MED ORDER — HYDROCORTISONE ACE-PRAMOXINE 1-1 % RE FOAM
1.0000 | Freq: Two times a day (BID) | RECTAL | Status: DC
  Administered 2015-06-06: 1 via RECTAL
  Filled 2015-06-06: qty 10

## 2015-06-06 MED ORDER — LIDOCAINE HCL 2 % EX GEL
1.0000 "application " | Freq: Once | CUTANEOUS | Status: AC
  Administered 2015-06-06: 1 via TOPICAL
  Filled 2015-06-06: qty 10

## 2015-06-06 MED ORDER — FLEET ENEMA 7-19 GM/118ML RE ENEM
1.0000 | ENEMA | Freq: Once | RECTAL | Status: AC
  Administered 2015-06-06: 1 via RECTAL

## 2015-06-06 NOTE — ED Notes (Signed)
Rectal pain , H Neese NP in to examine pt.  Pt alert, Painful exam , with sl bleeding from hemorrhoid.

## 2015-06-06 NOTE — ED Notes (Signed)
Rectal pain for last 2 months.  Have tried home remedies without relief.  Rates pain 8/10.

## 2015-06-06 NOTE — ED Notes (Signed)
Phoned pharmacy for med order.

## 2015-06-06 NOTE — ED Provider Notes (Signed)
CSN: [id number]     Arrival date & time 06/06/15  1356 History   First MD Initiated Contact with Patient 06/06/15 1621     Chief Complaint  Patient presents with  . Rectal Pain     (Consider location/radiation/quality/duration/timing/severity/associated sxs/prior Treatment) The history is provided by the patient.   Felicia Weeks is a 54 y.o. female who presents to the ED with rectal pain that she reports has been ongoing for the past 2 months. She has had some episodes of constipation and when she strains to have a BM has had some bleeding at time. Last BM was 5 days ago. She states that she feels something at her anus and not sure if it is a hemorrhoid. She states that she does not have a PCP currently.   Past Medical History  Diagnosis Date  . DDD (degenerative disc disease)   . Hypertension    Past Surgical History  Procedure Laterality Date  . Back surgery     History reviewed. No pertinent family history. History  Substance Use Topics  . Smoking status: Current Every Day Smoker  . Smokeless tobacco: Not on file  . Alcohol Use: No   OB History    No data available     Review of Systems Negative except as stated in HPI   Allergies  Benadryl and Vicodin  Home Medications   Prior to Admission medications   Medication Sig Start Date End Date Taking? Authorizing Provider  metoprolol (LOPRESSOR) 50 MG tablet Take 50 mg by mouth 2 (two) times daily.    Historical Provider, MD  Naproxen Sodium (ALEVE) 220 MG CAPS Take 440 mg by mouth as needed.    Historical Provider, MD   BP 202/91 mmHg  Pulse 102  Temp(Src) 98.2 F (36.8 C) (Oral)  Resp 16  Ht 5\' 6"  (1.676 m)  Wt 352 lb (159.666 kg)  BMI 56.84 kg/m2  SpO2 99% Physical Exam  Constitutional: She is oriented to person, place, and time. She appears well-developed and well-nourished. No distress.  HENT:  Head: Normocephalic.  Eyes: EOM are normal.  Neck: Neck supple.  Cardiovascular: Normal rate.    Pulmonary/Chest: Effort normal.  Abdominal: Soft. Bowel sounds are normal. There is no tenderness.  Genitourinary: Rectal exam shows external hemorrhoid and tenderness. Rectal exam shows no mass and anal tone normal.  Hemorrhoid started bleeding during rectal exam. Large amount of stool palpated on exam.   Musculoskeletal: Normal range of motion.  Neurological: She is alert and oriented to person, place, and time. No cranial nerve deficit.  Skin: Skin is warm and dry.  Psychiatric: She has a normal mood and affect. Her behavior is normal.  Nursing note and vitals reviewed.   ED Course  Procedures (including critical care time) Enema, fleet with good results.  Proctofoam with first dose inserted here with instructions to patient. F/u with Dr. Lovell SheehanJenkins, general surgeon Stool softeners Dr. Blinda LeatherwoodPollina in to see the patient and discuss plan of care.   Labs Review  MDM  54 y.o. morbidly obese female with constipation and hemorrhoid. Stable for d/c to follow up with general surgeon. Discussed taking stool softeners and high fiber diet. Patient's BP was normal on admission to the ED and elevated at time of d/c. Dr. Blinda LeatherwoodPollina discussed with the patient need for follow up but will not start BP medications today since initial BP was normal.   Final diagnoses:  External hemorrhoid, bleeding  Constipation, unspecified constipation type  [place], NP 06/06/15 2214  Gilda Crease, MD 06/06/15 [phone]

## 2015-06-06 NOTE — Discharge Instructions (Signed)
Use the medication twice a day. Call Dr. Lovell Sheehan office for follow up. Return here as needed. Take a stool softener daily to prevent constipation and do not strain when having a stool.   Hemorrhoids Hemorrhoids are swollen veins around the rectum or anus. There are two types of hemorrhoids:   Internal hemorrhoids. These occur in the veins just inside the rectum. They may poke through to the outside and become irritated and painful.  External hemorrhoids. These occur in the veins outside the anus and can be felt as a painful swelling or hard lump near the anus. CAUSES  Pregnancy.   Obesity.   Constipation or diarrhea.   Straining to have a bowel movement.   Sitting for long periods on the toilet.  Heavy lifting or other activity that caused you to strain.  Anal intercourse. SYMPTOMS   Pain.   Anal itching or irritation.   Rectal bleeding.   Fecal leakage.   Anal swelling.   One or more lumps around the anus.  DIAGNOSIS  Your caregiver may be able to diagnose hemorrhoids by visual examination. Other examinations or tests that may be performed include:   Examination of the rectal area with a gloved hand (digital rectal exam).   Examination of anal canal using a small tube (scope).   A blood test if you have lost a significant amount of blood.  A test to look inside the colon (sigmoidoscopy or colonoscopy). TREATMENT Most hemorrhoids can be treated at home. However, if symptoms do not seem to be getting better or if you have a lot of rectal bleeding, your caregiver may perform a procedure to help make the hemorrhoids get smaller or remove them completely. Possible treatments include:   Placing a rubber band at the base of the hemorrhoid to cut off the circulation (rubber band ligation).   Injecting a chemical to shrink the hemorrhoid (sclerotherapy).   Using a tool to burn the hemorrhoid (infrared light therapy).   Surgically removing the hemorrhoid  (hemorrhoidectomy).   Stapling the hemorrhoid to block blood flow to the tissue (hemorrhoid stapling).  HOME CARE INSTRUCTIONS   Eat foods with fiber, such as whole grains, beans, nuts, fruits, and vegetables. Ask your doctor about taking products with added fiber in them (fibersupplements).  Increase fluid intake. Drink enough water and fluids to keep your urine clear or pale yellow.   Exercise regularly.   Go to the bathroom when you have the urge to have a bowel movement. Do not wait.   Avoid straining to have bowel movements.   Keep the anal area dry and clean. Use wet toilet paper or moist towelettes after a bowel movement.   Medicated creams and suppositories may be used or applied as directed.   Only take over-the-counter or prescription medicines as directed by your caregiver.   Take warm sitz baths for 15-20 minutes, 3-4 times a day to ease pain and discomfort.   Place ice packs on the hemorrhoids if they are tender and swollen. Using ice packs between sitz baths may be helpful.   Put ice in a plastic bag.   Place a towel between your skin and the bag.   Leave the ice on for 15-20 minutes, 3-4 times a day.   Do not use a donut-shaped pillow or sit on the toilet for long periods. This increases blood pooling and pain.  SEEK MEDICAL CARE IF:  You have increasing pain and swelling that is not controlled by treatment or medicine.  You  have uncontrolled bleeding.  You have difficulty or you are unable to have a bowel movement.  You have pain or inflammation outside the area of the hemorrhoids. MAKE SURE YOU:  Understand these instructions.  Will watch your condition.  Will get help right away if you are not doing well or get worse. Document Released: 11/20/2000 Document Revised: 11/09/2012 Document Reviewed: 09/27/2012 [facility] Patient Information 2015 [place], [place]. This information is not intended to replace advice given to you by your health  care provider. Make sure you discuss any questions you have with your health care provider.  Constipation Constipation is when a person:  Poops (has a bowel movement) less than 3 times a week.  Has a hard time pooping.  Has poop that is dry, hard, or bigger than normal. HOME CARE   Eat foods with a lot of fiber in them. This includes fruits, vegetables, beans, and whole grains such as brown rice.  Avoid fatty foods and foods with a lot of sugar. This includes french fries, hamburgers, cookies, candy, and soda.  If you are not getting enough fiber from food, take products with added fiber in them (supplements).  Drink enough fluid to keep your pee (urine) clear or pale yellow.  Exercise on a regular basis, or as told by your doctor.  Go to the restroom when you feel like you need to poop. Do not hold it.  Only take medicine as told by your doctor. Do not take medicines that help you poop (laxatives) without talking to your doctor first. GET HELP RIGHT AWAY IF:   You have bright red blood in your poop (stool).  Your constipation lasts more than 4 days or gets worse.  You have belly (abdominal) or butt (rectal) pain.  You have thin poop (as thin as a pencil).  You lose weight, and it cannot be explained. MAKE SURE YOU:   Understand these instructions.  Will watch your condition.  Will get help right away if you are not doing well or get worse. Document Released: 05/11/2008 Document Revised: 11/28/2013 Document Reviewed: 09/04/2013 [facility] LPExitCare Patient Information 2015 [place], [place]. This information is not intended to replace advice given to you by your health care provider. Make sure you discuss any questions you have with your health care provider.

## 2015-06-07 ENCOUNTER — Telehealth: Payer: Self-pay | Admitting: Family Medicine

## 2015-06-07 NOTE — Telephone Encounter (Signed)
Patient had elevated blood pressure at hospital visit yesterday.  She needs a PCP. Appt scheduled for 07/08/15.  Asked her to keep a record of her blood pressure and to check back periodically to see if we have a new patient appointment open up sooner. Go to the emergency room if she develops any signs of an emergency. Patient stated understanding and agreement to plan.

## 2015-06-29 DIAGNOSIS — Z888 Allergy status to other drugs, medicaments and biological substances status: Secondary | ICD-10-CM | POA: Diagnosis not present

## 2015-06-29 DIAGNOSIS — R0789 Other chest pain: Secondary | ICD-10-CM | POA: Diagnosis not present

## 2015-06-29 DIAGNOSIS — E780 Pure hypercholesterolemia: Secondary | ICD-10-CM | POA: Diagnosis not present

## 2015-06-29 DIAGNOSIS — K641 Second degree hemorrhoids: Secondary | ICD-10-CM | POA: Diagnosis not present

## 2015-06-29 DIAGNOSIS — Z6841 Body Mass Index (BMI) 40.0 and over, adult: Secondary | ICD-10-CM | POA: Diagnosis not present

## 2015-06-29 DIAGNOSIS — K644 Residual hemorrhoidal skin tags: Secondary | ICD-10-CM | POA: Diagnosis not present

## 2015-06-29 DIAGNOSIS — I10 Essential (primary) hypertension: Secondary | ICD-10-CM | POA: Diagnosis not present

## 2015-06-29 DIAGNOSIS — Z885 Allergy status to narcotic agent status: Secondary | ICD-10-CM | POA: Diagnosis not present

## 2015-06-29 DIAGNOSIS — F17210 Nicotine dependence, cigarettes, uncomplicated: Secondary | ICD-10-CM | POA: Diagnosis not present

## 2015-06-29 DIAGNOSIS — R079 Chest pain, unspecified: Secondary | ICD-10-CM | POA: Diagnosis not present

## 2015-06-29 DIAGNOSIS — E785 Hyperlipidemia, unspecified: Secondary | ICD-10-CM | POA: Diagnosis not present

## 2015-06-29 DIAGNOSIS — K922 Gastrointestinal hemorrhage, unspecified: Secondary | ICD-10-CM | POA: Diagnosis not present

## 2015-06-29 DIAGNOSIS — R791 Abnormal coagulation profile: Secondary | ICD-10-CM | POA: Diagnosis not present

## 2015-06-29 DIAGNOSIS — F17200 Nicotine dependence, unspecified, uncomplicated: Secondary | ICD-10-CM | POA: Diagnosis not present

## 2015-06-29 DIAGNOSIS — R0602 Shortness of breath: Secondary | ICD-10-CM | POA: Diagnosis not present

## 2015-06-29 DIAGNOSIS — K219 Gastro-esophageal reflux disease without esophagitis: Secondary | ICD-10-CM | POA: Diagnosis not present

## 2015-06-29 DIAGNOSIS — Z823 Family history of stroke: Secondary | ICD-10-CM | POA: Diagnosis not present

## 2015-06-29 DIAGNOSIS — R0600 Dyspnea, unspecified: Secondary | ICD-10-CM | POA: Diagnosis not present

## 2015-06-29 DIAGNOSIS — Z79899 Other long term (current) drug therapy: Secondary | ICD-10-CM | POA: Diagnosis not present

## 2015-06-29 DIAGNOSIS — Z8249 Family history of ischemic heart disease and other diseases of the circulatory system: Secondary | ICD-10-CM | POA: Diagnosis not present

## 2015-06-30 DIAGNOSIS — R079 Chest pain, unspecified: Secondary | ICD-10-CM | POA: Diagnosis not present

## 2015-07-04 DIAGNOSIS — R079 Chest pain, unspecified: Secondary | ICD-10-CM | POA: Diagnosis not present

## 2015-07-08 ENCOUNTER — Ambulatory Visit: Payer: Self-pay | Admitting: Physician Assistant

## 2015-07-08 DIAGNOSIS — R079 Chest pain, unspecified: Secondary | ICD-10-CM | POA: Diagnosis not present

## 2015-07-25 ENCOUNTER — Ambulatory Visit: Payer: Self-pay | Admitting: Physician Assistant

## 2015-08-02 ENCOUNTER — Ambulatory Visit (INDEPENDENT_AMBULATORY_CARE_PROVIDER_SITE_OTHER): Payer: Medicare Other | Admitting: Physician Assistant

## 2015-08-02 ENCOUNTER — Encounter: Payer: Self-pay | Admitting: Physician Assistant

## 2015-08-02 ENCOUNTER — Encounter (INDEPENDENT_AMBULATORY_CARE_PROVIDER_SITE_OTHER): Payer: Self-pay

## 2015-08-02 VITALS — BP 158/83 | HR 101 | Temp 97.5°F | Ht 66.0 in | Wt 312.0 lb

## 2015-08-02 DIAGNOSIS — K649 Unspecified hemorrhoids: Secondary | ICD-10-CM | POA: Diagnosis not present

## 2015-08-02 DIAGNOSIS — Z720 Tobacco use: Secondary | ICD-10-CM

## 2015-08-02 DIAGNOSIS — Z13220 Encounter for screening for lipoid disorders: Secondary | ICD-10-CM

## 2015-08-02 DIAGNOSIS — Z0000 Encounter for general adult medical examination without abnormal findings: Secondary | ICD-10-CM

## 2015-08-02 DIAGNOSIS — I10 Essential (primary) hypertension: Secondary | ICD-10-CM

## 2015-08-02 NOTE — Progress Notes (Signed)
   Subjective:    Patient ID: Felicia Weeks, female    DOB: 03/21/1961, 54 y.o.   MRN: [id number]  HPI  54 y/o female with recent diagnosis of essential htn presents for establishment of care. She was recently seen at [facility] for hemorrhoids (has appt with GI for further assessment) . She was also having chest pain so she was referred to Cardiologist and had a stress test within the past month. She is unsure of the results. She has recently started Amlodipine 26m  ( 1/2 tablet daily) She has refills for this medication and will follow up with Dr. SMckinley Jewel   Smoker. States that she smokes 1 ppd.   She is having significant pain with her hemorrhoids but is trying to control the pain with tylenol since narcotics have SE of constipation.   She has not had colonoscopy but is planning to have one with GI after hemorrhoids are treated.     Review of Systems  Constitutional: Negative.   HENT: Negative.   Eyes: Negative.   Respiratory: Negative.   Cardiovascular: Positive for chest pain (occasional CP, recent stress test with Cardiologist).  Gastrointestinal: Positive for anal bleeding (diagnosed with hemorrhoids - has appt with GI).  Endocrine: Negative.   Genitourinary: Negative.   Musculoskeletal: Negative.   Skin: Negative.   Allergic/Immunologic: Negative.   Neurological: Negative.   Hematological: Negative.   Psychiatric/Behavioral: Negative.        Objective:   Physical Exam  Constitutional: She is oriented to person, place, and time. She appears well-developed and well-nourished. No distress.  HENT:  Head: Normocephalic.  Right Ear: External ear normal.  Left Ear: External ear normal.  Mouth/Throat: Oropharynx is clear and moist. No oropharyngeal exudate.  Cardiovascular: Normal rate and normal heart sounds.  Exam reveals no gallop and no friction rub.   No murmur heard. Mild htn  Pulmonary/Chest: Effort normal and breath sounds normal. No respiratory distress. She  has no wheezes. She has no rales. She exhibits no tenderness.  Abdominal: She exhibits no distension. There is no tenderness.  Morbidly obese   Neurological: She is alert and oriented to person, place, and time.  Skin: She is not diaphoretic.  Psychiatric: She has a normal mood and affect. Her behavior is normal. Judgment and thought content normal.  Nursing note and vitals reviewed.         Assessment & Plan:  1. Essential hypertension  - Lipid panel - CMP14+EGFR - TSH - CBC with Differential/Platelet  2. Preventative health care  - Lipid panel - CMP14+EGFR - TSH - CBC with Differential/Platelet  3. Morbid obesity  - Lipid panel - CMP14+EGFR - TSH - CBC with Differential/Platelet  4. Tobacco abuse  - CBC with Differential/Platelet  5. Hemorrhoids, unspecified hemorrhoid type  - CBC with Differential/Platelet  6. Screening cholesterol level  - Lipid panel - CBC with Differential/Platelet   Continue all meds Labs pending Health Maintenance reviewed Diet and exercise encouraged RTO depending on lab results Keep follow up with Cardiologist and GI  Felicia Weeks A. GBenjamin StainPA-C

## 2015-08-03 LAB — CMP14+EGFR
A/G RATIO: 1.4 (ref 1.1–2.5)
ALBUMIN: 4.2 g/dL (ref 3.5–5.5)
ALK PHOS: 120 IU/L — AB (ref 39–117)
ALT: 20 IU/L (ref 0–32)
AST: 14 IU/L (ref 0–40)
BILIRUBIN TOTAL: 0.4 mg/dL (ref 0.0–1.2)
BUN / CREAT RATIO: 11 (ref 9–23)
BUN: 10 mg/dL (ref 6–24)
CHLORIDE: 99 mmol/L (ref 97–108)
CO2: 23 mmol/L (ref 18–29)
Calcium: 9.5 mg/dL (ref 8.7–10.2)
Creatinine, Ser: 0.88 mg/dL (ref 0.57–1.00)
GFR calc non Af Amer: 74 mL/min/{1.73_m2} (ref >59)
GFR, EST AFRICAN AMERICAN: 86 mL/min/{1.73_m2} (ref >59)
GLOBULIN, TOTAL: 3.1 g/dL (ref 1.5–4.5)
GLUCOSE: 107 mg/dL — AB (ref 65–99)
POTASSIUM: 4.5 mmol/L (ref 3.5–5.2)
SODIUM: 140 mmol/L (ref 134–144)
Total Protein: 7.3 g/dL (ref 6.0–8.5)

## 2015-08-03 LAB — CBC WITH DIFFERENTIAL/PLATELET
BASOS ABS: 0 10*3/uL (ref 0.0–0.2)
Basos: 1 %
EOS (ABSOLUTE): 0.3 10*3/uL (ref 0.0–0.4)
Eos: 6 %
Hematocrit: 45 % (ref 34.0–46.6)
Hemoglobin: 15.4 g/dL (ref 11.1–15.9)
IMMATURE GRANS (ABS): 0 10*3/uL (ref 0.0–0.1)
Immature Granulocytes: 0 %
LYMPHS ABS: 0.9 10*3/uL (ref 0.7–3.1)
LYMPHS: 17 %
MCH: 32.4 pg (ref 26.6–33.0)
MCHC: 34.2 g/dL (ref 31.5–35.7)
MCV: 95 fL (ref 79–97)
Monocytes Absolute: 0.4 10*3/uL (ref 0.1–0.9)
Monocytes: 7 %
NEUTROS ABS: 3.9 10*3/uL (ref 1.4–7.0)
Neutrophils: 69 %
PLATELETS: 188 10*3/uL (ref 150–379)
RBC: 4.75 x10E6/uL (ref 3.77–5.28)
RDW: 13.7 % (ref 12.3–15.4)
WBC: 5.5 10*3/uL (ref 3.4–10.8)

## 2015-08-03 LAB — LIPID PANEL
CHOLESTEROL TOTAL: 261 mg/dL — AB (ref 100–199)
Chol/HDL Ratio: 5.1 ratio units — ABNORMAL HIGH (ref 0.0–4.4)
HDL: 51 mg/dL (ref >39)
LDL Calculated: 181 mg/dL — ABNORMAL HIGH (ref 0–99)
TRIGLYCERIDES: 146 mg/dL (ref 0–149)
VLDL CHOLESTEROL CAL: 29 mg/dL (ref 5–40)

## 2015-08-03 LAB — TSH: TSH: 4.37 u[IU]/mL (ref 0.450–4.500)

## 2015-11-30 DIAGNOSIS — R110 Nausea: Secondary | ICD-10-CM | POA: Diagnosis not present

## 2015-11-30 DIAGNOSIS — M791 Myalgia: Secondary | ICD-10-CM | POA: Diagnosis not present

## 2015-11-30 DIAGNOSIS — F17200 Nicotine dependence, unspecified, uncomplicated: Secondary | ICD-10-CM | POA: Diagnosis not present

## 2015-11-30 DIAGNOSIS — R0789 Other chest pain: Secondary | ICD-10-CM | POA: Diagnosis not present

## 2015-11-30 DIAGNOSIS — K219 Gastro-esophageal reflux disease without esophagitis: Secondary | ICD-10-CM | POA: Diagnosis not present

## 2015-11-30 DIAGNOSIS — I10 Essential (primary) hypertension: Secondary | ICD-10-CM | POA: Diagnosis not present

## 2015-11-30 DIAGNOSIS — R079 Chest pain, unspecified: Secondary | ICD-10-CM | POA: Diagnosis not present

## 2015-11-30 DIAGNOSIS — M25512 Pain in left shoulder: Secondary | ICD-10-CM | POA: Diagnosis not present

## 2015-12-11 ENCOUNTER — Telehealth: Payer: Self-pay | Admitting: Family Medicine

## 2015-12-19 NOTE — Telephone Encounter (Signed)
Denied.

## 2016-01-22 ENCOUNTER — Telehealth: Payer: Self-pay | Admitting: Family Medicine

## 2016-08-04 ENCOUNTER — Telehealth: Payer: Self-pay | Admitting: Pediatrics

## 2016-08-31 ENCOUNTER — Ambulatory Visit (INDEPENDENT_AMBULATORY_CARE_PROVIDER_SITE_OTHER): Payer: Medicare Other | Admitting: Nurse Practitioner

## 2016-08-31 ENCOUNTER — Encounter: Payer: Self-pay | Admitting: Nurse Practitioner

## 2016-08-31 VITALS — BP 172/88 | HR 120 | Temp 98.7°F | Ht 66.0 in | Wt 334.4 lb

## 2016-08-31 DIAGNOSIS — J209 Acute bronchitis, unspecified: Secondary | ICD-10-CM | POA: Diagnosis not present

## 2016-08-31 DIAGNOSIS — I10 Essential (primary) hypertension: Secondary | ICD-10-CM | POA: Diagnosis not present

## 2016-08-31 MED ORDER — METOPROLOL TARTRATE 25 MG PO TABS: 25.0000 mg | ORAL_TABLET | Freq: Two times a day (BID) | ORAL | 3 refills | Status: AC

## 2016-08-31 MED ORDER — LISINOPRIL-HYDROCHLOROTHIAZIDE 20-12.5 MG PO TABS: 1.0000 | ORAL_TABLET | Freq: Every day | ORAL | 1 refills | Status: DC

## 2016-08-31 MED ORDER — AMOXICILLIN 875 MG PO TABS: 875.0000 mg | ORAL_TABLET | Freq: Two times a day (BID) | ORAL | 0 refills | Status: DC

## 2016-08-31 NOTE — Progress Notes (Signed)
Subjective:     Nancee LiterJeanie D Brilliant is a 55 y.o. female who presents for evaluation of sinus pain. Symptoms include: congestion, cough, facial pain, fevers, headaches and sore throat. Onset of symptoms was 2 weeks ago. Symptoms have been gradually worsening since that time. Past history is significant for no history of pneumonia or bronchitis. Patient is a smoker  (1 ppd x 30 yrs).  The following portions of the patient's history were reviewed and updated as appropriate: allergies, current medications, past family history, past medical history, past social history, past surgical history and problem list.  * patient has been out of blood pressure medicine for 1 month- needed follow up for labs but patient would not come in.Says that she does not usually take everyday anyway. Chart says she is on amlodipine but she says she is on metoprolol. Review of Systems Pertinent items noted in HPI and remainder of comprehensive ROS otherwise negative.   Objective:    BP (!) 189/91 (BP Location: Left Arm, Patient Position: Sitting, Cuff Size: Normal)   Pulse (!) 120   Temp 98.7 F (37.1 C) (Oral)   Ht 5\' 6"  (1.676 m)   Wt (!) 334 lb 6.4 oz (151.7 kg)   SpO2 98%   BMI 53.97 kg/m  General appearance: alert and cooperative Eyes: conjunctivae/corneas clear. PERRL, EOM's intact. Fundi benign. Ears: normal TM's and external ear canals both ears Nose: clear discharge, moderate congestion, turbinates red, no sinus tenderness Throat: lips, mucosa, and tongue normal; teeth and gums normal Neck: no adenopathy, no carotid bruit, no JVD, supple, symmetrical, trachea midline and thyroid not enlarged, symmetric, no tenderness/mass/nodules Lungs: wheezes faint inspriatory wheezes bil bases Heart: regular rate and rhythm, S1, S2 normal, no murmur, click, rub or gallop    Assessment:    Acute bacterial sinusitis and bronchitis Hypertension.    Plan:   1. Take meds as prescribed 2. Use a cool mist humidifier  especially during the winter months and when heat has been humid. 3. Use saline nose sprays frequently 4. Saline irrigations of the nose can be very helpful if done frequently.  * 4X daily for 1 week*  * Use of a nettie pot can be helpful with this. Follow directions with this* 5. Drink plenty of fluids 6. Keep thermostat turn down low 7.For any cough or congestion  Use plain Mucinex- regular strength or max strength is fine- avoid decongestants   * Children- consult with Pharmacist for dosing 8. For fever or aces or pains- take tylenol or ibuprofen appropriate for age and weight.  * for fevers greater than 101 orally you may alternate ibuprofen and tylenol every  3 hours.   DO not add salt to diet Added metoprolol and lisinopril /Hctz to meds CMPP pending Follow uo in 3 months  Mary-Margaret Daphine DeutscherMartin, FNP

## 2016-08-31 NOTE — Patient Instructions (Signed)

## 2016-09-01 LAB — CMP14+EGFR
ALK PHOS: 120 IU/L — AB (ref 39–117)
ALT: 26 IU/L (ref 0–32)
AST: 30 IU/L (ref 0–40)
Albumin/Globulin Ratio: 1.2 (ref 1.2–2.2)
Albumin: 4.4 g/dL (ref 3.5–5.5)
BILIRUBIN TOTAL: 0.3 mg/dL (ref 0.0–1.2)
BUN/Creatinine Ratio: 12 (ref 9–23)
BUN: 15 mg/dL (ref 6–24)
CHLORIDE: 97 mmol/L (ref 96–106)
CO2: 23 mmol/L (ref 18–29)
Calcium: 9.2 mg/dL (ref 8.7–10.2)
Creatinine, Ser: 1.29 mg/dL — ABNORMAL HIGH (ref 0.57–1.00)
GFR calc Af Amer: 53 mL/min/{1.73_m2} — ABNORMAL LOW (ref >59)
GFR calc non Af Amer: 46 mL/min/{1.73_m2} — ABNORMAL LOW (ref >59)
GLUCOSE: 94 mg/dL (ref 65–99)
Globulin, Total: 3.6 g/dL (ref 1.5–4.5)
Potassium: 4.7 mmol/L (ref 3.5–5.2)
Sodium: 141 mmol/L (ref 134–144)
TOTAL PROTEIN: 8 g/dL (ref 6.0–8.5)

## 2016-10-18 DIAGNOSIS — R05 Cough: Secondary | ICD-10-CM | POA: Diagnosis not present

## 2016-10-18 DIAGNOSIS — J40 Bronchitis, not specified as acute or chronic: Secondary | ICD-10-CM | POA: Diagnosis not present

## 2016-12-01 ENCOUNTER — Ambulatory Visit: Payer: Medicare Other | Admitting: Nurse Practitioner

## 2016-12-02 ENCOUNTER — Encounter: Payer: Self-pay | Admitting: Pediatrics

## 2016-12-10 ENCOUNTER — Encounter: Payer: Self-pay | Admitting: Nurse Practitioner

## 2016-12-10 ENCOUNTER — Encounter: Payer: Self-pay | Admitting: Gastroenterology

## 2016-12-10 ENCOUNTER — Ambulatory Visit (INDEPENDENT_AMBULATORY_CARE_PROVIDER_SITE_OTHER): Payer: Medicare Other | Admitting: Nurse Practitioner

## 2016-12-10 VITALS — BP 130/68 | HR 66 | Temp 97.2°F | Ht 66.0 in | Wt 339.0 lb

## 2016-12-10 DIAGNOSIS — I10 Essential (primary) hypertension: Secondary | ICD-10-CM | POA: Diagnosis not present

## 2016-12-10 DIAGNOSIS — Z1159 Encounter for screening for other viral diseases: Secondary | ICD-10-CM

## 2016-12-10 DIAGNOSIS — Z1211 Encounter for screening for malignant neoplasm of colon: Secondary | ICD-10-CM | POA: Diagnosis not present

## 2016-12-10 DIAGNOSIS — Z23 Encounter for immunization: Secondary | ICD-10-CM

## 2016-12-10 DIAGNOSIS — Z1231 Encounter for screening mammogram for malignant neoplasm of breast: Secondary | ICD-10-CM | POA: Diagnosis not present

## 2016-12-10 MED ORDER — LISINOPRIL-HYDROCHLOROTHIAZIDE 20-12.5 MG PO TABS: 1.0000 | ORAL_TABLET | Freq: Every day | ORAL | 1 refills | Status: AC

## 2016-12-10 NOTE — Progress Notes (Signed)
   Subjective:    Patient ID: Felicia Weeks, female    DOB: Aug 03, 1961, 56 y.o.   MRN: [id number]  Patient here today for follow up of chronic medical problems. No complaints today.  Outpatient Encounter Prescriptions as of 12/10/2016  Medication Sig  . lisinopril-hydrochlorothiazide (ZESTORETIC) 20-12.5 MG tablet Take 1 tablet by mouth daily.  . metoprolol tartrate (LOPRESSOR) 25 MG tablet Take 1 tablet (25 mg total) by mouth 2 (two) times daily.   Hypertension  This is a chronic problem. The current episode started more than 1 year ago. The problem is unchanged. The problem is controlled. Pertinent negatives include no blurred vision, chest pain, headaches, neck pain, palpitations, peripheral edema or shortness of breath. There are no associated agents to hypertension. Risk factors for coronary artery disease include obesity, post-menopausal state and sedentary lifestyle. Past treatments include ACE inhibitors, beta blockers and diuretics. The current treatment provides significant improvement. Compliance problems include diet and exercise.  There is no history of CAD/MI or CVA. There is no history of chronic renal disease.       Review of Systems  Constitutional: Negative.   HENT: Negative.   Eyes: Negative for blurred vision.  Respiratory: Negative for shortness of breath.   Cardiovascular: Negative for chest pain and palpitations.  Genitourinary: Negative.   Musculoskeletal: Negative for neck pain.  Neurological: Negative for headaches.  Psychiatric/Behavioral: Negative.   All other systems reviewed and are negative.      Objective:   Physical Exam  Constitutional: She is oriented to person, place, and time. She appears well-developed and well-nourished.  HENT:  Nose: Nose normal.  Mouth/Throat: Oropharynx is clear and moist.  Eyes: EOM are normal.  Neck: Trachea normal, normal range of motion and full passive range of motion without pain. Neck supple. No JVD present.  Carotid bruit is not present. No thyromegaly present.  Cardiovascular: Normal rate, regular rhythm, normal heart sounds and intact distal pulses.  Exam reveals no gallop and no friction rub.   No murmur heard. Pulmonary/Chest: Effort normal and breath sounds normal.  Abdominal: Soft. Bowel sounds are normal. She exhibits no distension and no mass. There is no tenderness.  Musculoskeletal: Normal range of motion.  Lymphadenopathy:    She has no cervical adenopathy.  Neurological: She is alert and oriented to person, place, and time. She has normal reflexes.  Skin: Skin is warm and dry.  Psychiatric: She has a normal mood and affect. Her behavior is normal. Judgment and thought content normal.    BP 130/68   Pulse 66   Temp 97.2 F (36.2 C) (Oral)   Ht _0  (1.676 m)   Wt (!) 339 lb (153.8 kg)   BMI 54.72 kg/m        Assessment & Plan:  1. Essential hypertension Low sodium diet - BMP8+EGFR - Lipid panel - lisinopril-hydrochlorothiazide (ZESTORETIC) 20-12.5 MG tablet; Take 1 tablet by mouth daily.  Dispense: 90 tablet; Refill: 1  2. Morbid obesity ([place]) Discussed diet and exercise for person with BMI >25 Will recheck weight in 3-6 months  3. Need for hepatitis C screening test - Hepatitis C antibody  4. Encounter for screening colonoscopy - Ambulatory referral to Gastroenterology  5. Screening mammogram, encounter for - MM Digital Screening; Future    Labs pending Health maintenance reviewed Diet and exercise encouraged Continue all meds Follow up  In 6 month   [place], FNP

## 2016-12-10 NOTE — Addendum Note (Signed)
Addended by: Cleda DaubUCKER, AMANDA G on: 12/10/2016 12:39 PM   Modules accepted: Orders

## 2016-12-10 NOTE — Patient Instructions (Signed)
Fat and Cholesterol Restricted Diet Introduction Getting too much fat and cholesterol in your diet may cause health problems. Following this diet helps keep your fat and cholesterol at normal levels. This can keep you from getting sick. What types of fat should I choose?  Choose monosaturated and polyunsaturated fats. These are found in foods such as olive oil, canola oil, flaxseeds, walnuts, almonds, and seeds.  Eat more omega-3 fats. Good choices include salmon, mackerel, sardines, tuna, flaxseed oil, and ground flaxseeds.  Limit saturated fats. These are in animal products such as meats, butter, and cream. They can also be in plant products such as palm oil, palm kernel oil, and coconut oil.  Avoid foods with partially hydrogenated oils in them. These contain trans fats. Examples of foods that have trans fats are stick margarine, some tub margarines, cookies, crackers, and other baked goods. What general guidelines do I need to follow?  Check food labels. Look for the words "trans fat" and "saturated fat."  When preparing a meal:  Fill half of your plate with vegetables and green salads.  Fill one fourth of your plate with whole grains. Look for the word "whole" as the first word in the ingredient list.  Fill one fourth of your plate with lean protein foods.  Eat more foods that have fiber, like apples, carrots, beans, peas, and barley.  Eat more home-cooked foods. Eat less at restaurants and buffets.  Limit or avoid alcohol.  Limit foods high in starch and sugar.  Limit fried foods.  Cook foods without frying them. Baking, boiling, grilling, and broiling are all great options.  Lose weight if you are overweight. Losing even a small amount of weight can help your overall health. It can also help prevent diseases such as diabetes and heart disease. What foods can I eat? Grains  Whole grains, such as whole wheat or whole grain breads, crackers, cereals, and pasta. Unsweetened  oatmeal, bulgur, barley, quinoa, or brown rice. Corn or whole wheat flour tortillas. Vegetables  Fresh or frozen vegetables (raw, steamed, roasted, or grilled). Green salads. Fruits  All fresh, canned (in natural juice), or frozen fruits. Meat and Other Protein Products  Ground beef (85% or leaner), grass-fed beef, or beef trimmed of fat. Skinless chicken or turkey. Ground chicken or turkey. Pork trimmed of fat. All fish and seafood. Eggs. Dried beans, peas, or lentils. Unsalted nuts or seeds. Unsalted canned or dry beans. Dairy  Low-fat dairy products, such as skim or 1% milk, 2% or reduced-fat cheeses, low-fat ricotta or cottage cheese, or plain low-fat yogurt. Fats and Oils  Tub margarines without trans fats. Light or reduced-fat mayonnaise and salad dressings. Avocado. Olive, canola, sesame, or safflower oils. Natural peanut or almond butter (choose ones without added sugar and oil). The items listed above may not be a complete list of recommended foods or beverages. Contact your dietitian for more options.  What foods are not recommended? Grains  White bread. White pasta. White rice. Cornbread. Bagels, pastries, and croissants. Crackers that contain trans fat. Vegetables  White potatoes. Corn. Creamed or fried vegetables. Vegetables in a cheese sauce. Fruits  Dried fruits. Canned fruit in light or heavy syrup. Fruit juice. Meat and Other Protein Products  Fatty cuts of meat. Ribs, chicken wings, bacon, sausage, bologna, salami, chitterlings, fatback, hot dogs, bratwurst, and packaged luncheon meats. Liver and organ meats. Dairy  Whole or 2% milk, cream, half-and-half, and cream cheese. Whole milk cheeses. Whole-fat or sweetened yogurt. Full-fat cheeses. Nondairy creamers and   whipped toppings. Processed cheese, cheese spreads, or cheese curds. Sweets and Desserts  Corn syrup, sugars, honey, and molasses. Candy. Jam and jelly. Syrup. Sweetened cereals. Cookies, pies, cakes, donuts,  muffins, and ice cream. Fats and Oils  Butter, stick margarine, lard, shortening, ghee, or bacon fat. Coconut, palm kernel, or palm oils. Beverages  Alcohol. Sweetened drinks (such as sodas, lemonade, and fruit drinks or punches). The items listed above may not be a complete list of foods and beverages to avoid. Contact your dietitian for more information.  This information is not intended to replace advice given to you by your health care provider. Make sure you discuss any questions you have with your health care provider. Document Released: 05/24/2012 Document Revised: 07/30/2016 Document Reviewed: 02/22/2014  2017 Elsevier  

## 2016-12-11 ENCOUNTER — Other Ambulatory Visit: Payer: Self-pay | Admitting: Nurse Practitioner

## 2016-12-11 DIAGNOSIS — E782 Mixed hyperlipidemia: Secondary | ICD-10-CM

## 2016-12-11 DIAGNOSIS — E785 Hyperlipidemia, unspecified: Secondary | ICD-10-CM | POA: Insufficient documentation

## 2016-12-11 LAB — BMP8+EGFR
BUN/Creatinine Ratio: 11 (ref 9–23)
BUN: 14 mg/dL (ref 6–24)
CALCIUM: 9.3 mg/dL (ref 8.7–10.2)
CO2: 24 mmol/L (ref 18–29)
Chloride: 97 mmol/L (ref 96–106)
Creatinine, Ser: 1.26 mg/dL — ABNORMAL HIGH (ref 0.57–1.00)
GFR calc Af Amer: 55 mL/min/{1.73_m2} — ABNORMAL LOW (ref >59)
GFR, EST NON AFRICAN AMERICAN: 47 mL/min/{1.73_m2} — AB (ref >59)
Glucose: 85 mg/dL (ref 65–99)
Potassium: 4.4 mmol/L (ref 3.5–5.2)
Sodium: 139 mmol/L (ref 134–144)

## 2016-12-11 LAB — HEPATITIS C ANTIBODY

## 2016-12-11 LAB — LIPID PANEL
CHOL/HDL RATIO: 5.3 ratio — AB (ref 0.0–4.4)
Cholesterol, Total: 248 mg/dL — ABNORMAL HIGH (ref 100–199)
HDL: 47 mg/dL (ref >39)
LDL CALC: 158 mg/dL — AB (ref 0–99)
TRIGLYCERIDES: 215 mg/dL — AB (ref 0–149)
VLDL CHOLESTEROL CAL: 43 mg/dL — AB (ref 5–40)

## 2016-12-11 MED ORDER — ATORVASTATIN CALCIUM 40 MG PO TABS: 40.0000 mg | ORAL_TABLET | Freq: Every day | ORAL | 1 refills | Status: DC

## 2017-01-22 ENCOUNTER — Telehealth: Payer: Self-pay | Admitting: *Deleted

## 2017-01-22 NOTE — Telephone Encounter (Signed)
[facility] for [facility] outpatient endo is fine.  Will have to be May or June for routine screening colonoscopy due to limited slots available there.

## 2017-01-22 NOTE — Telephone Encounter (Signed)
Would you like an [facility] or [facility] for this patient?

## 2017-01-26 NOTE — Telephone Encounter (Signed)
Pt was contacted. She agrees with having to move the procedure to [facility] endo. Explained to her that we had no current openings, and that we would contact her after finding out more times at the hospital. She states clear understanding. I have [facility]

## 2017-02-12 ENCOUNTER — Encounter: Payer: Medicare Other | Admitting: Gastroenterology

## 2017-04-01 ENCOUNTER — Ambulatory Visit: Payer: Medicare Other | Admitting: Gastroenterology

## 2017-05-05 ENCOUNTER — Encounter: Payer: Medicare Other | Admitting: *Deleted

## 2017-06-10 ENCOUNTER — Ambulatory Visit: Payer: Medicare Other | Admitting: Nurse Practitioner

## 2017-06-11 ENCOUNTER — Encounter: Payer: Self-pay | Admitting: Pediatrics

## 2018-05-02 DIAGNOSIS — R05 Cough: Secondary | ICD-10-CM | POA: Diagnosis not present

## 2018-05-02 DIAGNOSIS — R062 Wheezing: Secondary | ICD-10-CM | POA: Diagnosis not present

## 2018-05-02 DIAGNOSIS — J40 Bronchitis, not specified as acute or chronic: Secondary | ICD-10-CM | POA: Diagnosis not present

## 2018-07-15 ENCOUNTER — Telehealth: Payer: Self-pay | Admitting: Pediatrics

## 2019-11-08 ENCOUNTER — Emergency Department (HOSPITAL_COMMUNITY)
Admission: EM | Admit: 2019-11-08 | Discharge: 2019-11-08 | Disposition: A | Discharge status: Home / Self Care | Payer: Medicare Other | Attending: Emergency Medicine | Admitting: Emergency Medicine

## 2019-11-08 ENCOUNTER — Emergency Department (HOSPITAL_COMMUNITY): Payer: Medicare Other

## 2019-11-08 ENCOUNTER — Other Ambulatory Visit: Payer: Self-pay

## 2019-11-08 ENCOUNTER — Encounter (HOSPITAL_COMMUNITY): Payer: Self-pay | Admitting: *Deleted

## 2019-11-08 DIAGNOSIS — J189 Pneumonia, unspecified organism: Secondary | ICD-10-CM

## 2019-11-08 DIAGNOSIS — Z79899 Other long term (current) drug therapy: Secondary | ICD-10-CM | POA: Diagnosis not present

## 2019-11-08 DIAGNOSIS — R079 Chest pain, unspecified: Secondary | ICD-10-CM

## 2019-11-08 DIAGNOSIS — J188 Other pneumonia, unspecified organism: Secondary | ICD-10-CM | POA: Diagnosis not present

## 2019-11-08 DIAGNOSIS — I10 Essential (primary) hypertension: Secondary | ICD-10-CM | POA: Diagnosis not present

## 2019-11-08 DIAGNOSIS — F17200 Nicotine dependence, unspecified, uncomplicated: Secondary | ICD-10-CM | POA: Diagnosis not present

## 2019-11-08 DIAGNOSIS — Z20828 Contact with and (suspected) exposure to other viral communicable diseases: Secondary | ICD-10-CM | POA: Insufficient documentation

## 2019-11-08 LAB — CBC WITH DIFFERENTIAL/PLATELET
Abs Immature Granulocytes: 0.01 10*3/uL (ref 0.00–0.07)
Basophils Absolute: 0 10*3/uL (ref 0.0–0.1)
Basophils Relative: 1 %
Eosinophils Absolute: 0.3 10*3/uL (ref 0.0–0.5)
Eosinophils Relative: 7 %
HCT: 45.3 % (ref 36.0–46.0)
Hemoglobin: 14.8 g/dL (ref 12.0–15.0)
Immature Granulocytes: 0 %
Lymphocytes Relative: 19 %
Lymphs Abs: 1 10*3/uL (ref 0.7–4.0)
MCH: 32.9 pg (ref 26.0–34.0)
MCHC: 32.7 g/dL (ref 30.0–36.0)
MCV: 100.7 fL — ABNORMAL HIGH (ref 80.0–100.0)
Monocytes Absolute: 0.5 10*3/uL (ref 0.1–1.0)
Monocytes Relative: 9 %
Neutro Abs: 3.4 10*3/uL (ref 1.7–7.7)
Neutrophils Relative %: 64 %
Platelets: 166 10*3/uL (ref 150–400)
RBC: 4.5 MIL/uL (ref 3.87–5.11)
RDW: 12.3 % (ref 11.5–15.5)
WBC: 5.2 10*3/uL (ref 4.0–10.5)
nRBC: 0 % (ref 0.0–0.2)

## 2019-11-08 LAB — BASIC METABOLIC PANEL
Anion gap: 11 (ref 5–15)
BUN: 14 mg/dL (ref 6–20)
CO2: 24 mmol/L (ref 22–32)
Calcium: 9.4 mg/dL (ref 8.9–10.3)
Chloride: 103 mmol/L (ref 98–111)
Creatinine, Ser: 1.11 mg/dL — ABNORMAL HIGH (ref 0.44–1.00)
GFR calc Af Amer: >60 mL/min (ref >60)
GFR calc non Af Amer: 54 mL/min — ABNORMAL LOW (ref >60)
Glucose, Bld: 90 mg/dL (ref 70–99)
Potassium: 4.1 mmol/L (ref 3.5–5.1)
Sodium: 138 mmol/L (ref 135–145)

## 2019-11-08 LAB — TROPONIN I (HIGH SENSITIVITY)
Troponin I (High Sensitivity): 4 ng/L (ref <18)
Troponin I (High Sensitivity): 5 ng/L (ref <18)

## 2019-11-08 LAB — D-DIMER, QUANTITATIVE: D-Dimer, Quant: 0.42 ug/mL-FEU (ref 0.00–0.50)

## 2019-11-08 IMAGING — DX DG CHEST 1V PORT
1 series · 1 of 1 positions shown · non-contrast
Comparison: Chest radiograph [DATE]

CLINICAL DATA: Chest pain. Additional history provided: Chest pain,
shortness of breath, back pain. Cough for past 3 weeks productive of
yellow sputum, runny nose.

EXAM:
PORTABLE CHEST 1 VIEW

[chest ap]
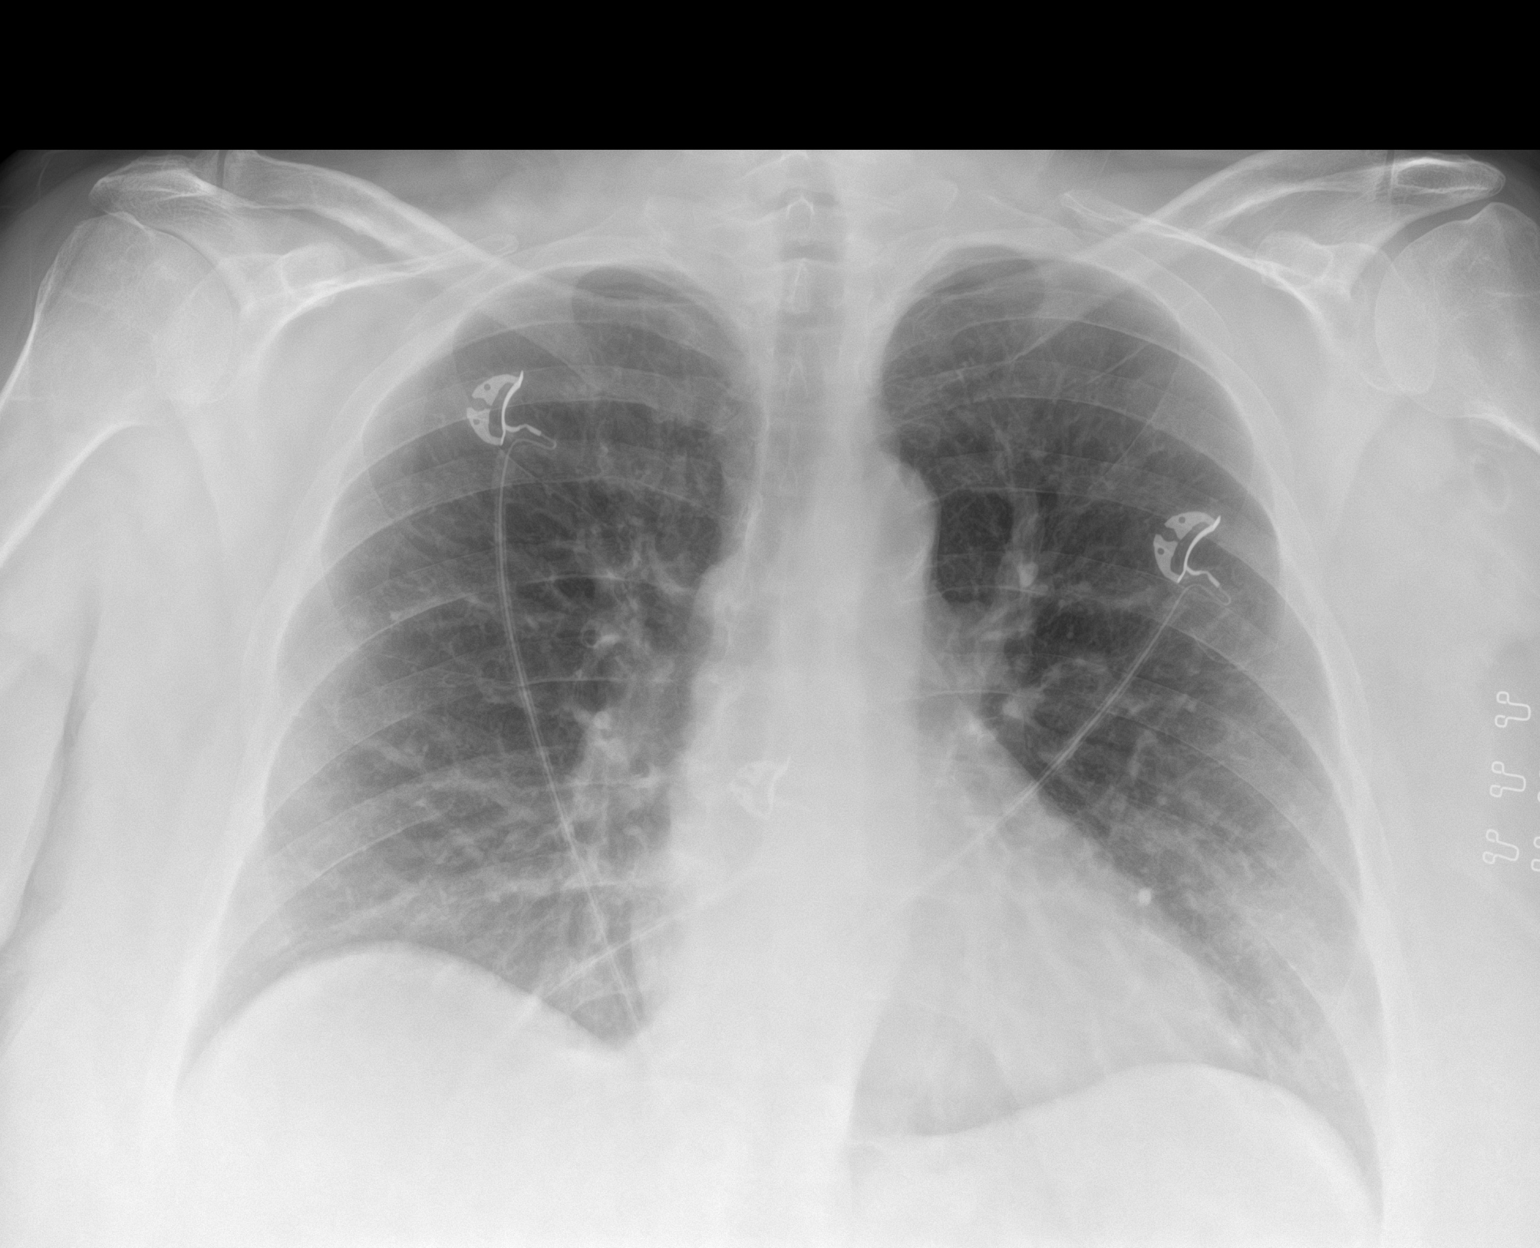

[1 of 1 positions shown; findings below may reference images not displayed]

FINDINGS: Heart size within normal limits.  Aortic atherosclerosis.

Minimal non-specific interstitial prominence most notable at the
right lung base. No airspace consolidation.

No evidence of pleural effusion or pneumothorax.

No acute bony abnormality.
IMPRESSION: Minimal nonspecific interstitial prominence greatest at the right
lung base. Atypical/viral pneumonia is difficult to exclude. No
airspace consolidation.

Aortic atherosclerosis.

## 2019-11-08 MED ORDER — DOXYCYCLINE HYCLATE 100 MG PO CAPS: 100.0000 mg | ORAL_CAPSULE | Freq: Two times a day (BID) | ORAL | 0 refills | Status: DC

## 2019-11-08 MED ORDER — ALBUTEROL SULFATE HFA 108 (90 BASE) MCG/ACT IN AERS
2.0000 | INHALATION_SPRAY | Freq: Once | RESPIRATORY_TRACT | Status: AC
  Administered 2019-11-08: 2 via RESPIRATORY_TRACT
  Filled 2019-11-08: qty 6.7

## 2019-11-08 NOTE — Discharge Instructions (Addendum)
Take doxycycline as prescribed and complete the full course. Use the inhaler 1 to 2 puffs every 4-6 hours as needed.   Quarantine until you know your Covid test results, if your results are positive you should quarantine for a total of 10 days of symptoms. Recheck with your primary care provider.  Return to ER for new or worsening symptoms.

## 2019-11-08 NOTE — ED Provider Notes (Signed)
[facility] Provider [facility]   CSN: [id number] Arrival date & time: 11/08/19  1334     History   Chief Complaint Chief Complaint  Patient presents with   Chest Pain    HPI Felicia Weeks is a 58 y.o. female.     58 year old female with history of hypertension, possible history of COPD presents with complaint of cough productive with yellow sputum x3 weeks, now with right-sided chest discomfort and pain in her left upper back and shoulder area.  Patient's been taking OTC cold medications for her symptoms, no known exposure to Covid, denies fevers, loss of taste or smell, diarrhea.  Patient does report chills without fever.  Patient states that she gets bronchitis about 2 times per year with pneumonia, questions if this is similar.  Pain in her chest and shoulder are worse with movement and palpation, no pain with exertion.  No other complaints or concerns today.     Past Medical History:  Diagnosis Date   DDD (degenerative disc disease)    Hypertension     Patient Active Problem List   Diagnosis Date Noted   Hyperlipidemia 12/11/2016   Morbid obesity (HCC) 12/10/2016   Essential hypertension 12/10/2016    Past Surgical History:  Procedure Laterality Date   BACK SURGERY       OB History   No obstetric history on file.      Home Medications    Prior to Admission medications   Medication Sig Start Date End Date Taking? Authorizing Provider  atorvastatin (LIPITOR) 40 MG tablet Take 1 tablet (40 mg total) by mouth daily. 12/11/16   Daphine DeutscherMartin, Mary-Margaret, FNP  doxycycline (VIBRAMYCIN) 100 MG capsule Take 1 capsule (100 mg total) by mouth 2 (two) times daily. 11/08/19   Jeannie FendMurphy, Tykera Skates A, PA-C  lisinopril-hydrochlorothiazide (ZESTORETIC) 20-12.5 MG tablet Take 1 tablet by mouth daily. 12/10/16   Daphine DeutscherMartin, Mary-Margaret, FNP  metoprolol tartrate (LOPRESSOR) 25 MG tablet Take 1 tablet (25 mg total) by mouth 2 (two) times daily. 08/31/16   Bennie PieriniMartin,  Mary-Margaret, FNP    Family History History reviewed. No pertinent family history.  Social History Social History   Tobacco Use   Smoking status: Current Every Day Smoker   Smokeless tobacco: Never Used  Substance Use Topics   Alcohol use: No   Drug use: No     Allergies   Benadryl [diphenhydramine] and Vicodin [hydrocodone-acetaminophen]   Review of Systems Review of Systems  Constitutional: Positive for chills. Negative for diaphoresis and fever.  HENT: Positive for congestion. Negative for sinus pressure, sinus pain and sneezing.   Respiratory: Positive for cough, shortness of breath and wheezing.   Cardiovascular: Positive for chest pain.  Gastrointestinal: Negative for abdominal pain, diarrhea, nausea and vomiting.  Musculoskeletal: Positive for arthralgias and myalgias.  Skin: Negative for rash and wound.  Allergic/Immunologic: Negative for immunocompromised state.  Neurological: Negative for weakness and numbness.  All other systems reviewed and are negative.    Physical Exam Updated Vital Signs BP (!) 170/61    Pulse 73    Temp 98.5 F (36.9 C) (Oral)    Resp 18    Ht 5\' 6"  (1.676 m)    Wt (!) 140.6 kg    SpO2 100%    BMI 50.04 kg/m   Physical Exam Vitals signs and nursing note reviewed.  Constitutional:      General: She is not in acute distress.    Appearance: She is well-developed. She is not diaphoretic.  HENT:  Head: Normocephalic and atraumatic.  Cardiovascular:     Rate and Rhythm: Normal rate and regular rhythm.     Heart sounds: Normal heart sounds. No murmur.  Pulmonary:     Effort: Pulmonary effort is normal.     Breath sounds: Examination of the right-upper field reveals wheezing. Examination of the left-upper field reveals wheezing. Examination of the right-middle field reveals wheezing. Examination of the left-middle field reveals wheezing. Examination of the right-lower field reveals decreased breath sounds and wheezing.  Examination of the left-lower field reveals decreased breath sounds and wheezing. Decreased breath sounds and wheezing present.  Chest:     Chest wall: Tenderness present.    Abdominal:     Palpations: Abdomen is soft.     Tenderness: There is no abdominal tenderness.  Musculoskeletal:       Back:     Right lower leg: No edema.     Left lower leg: No edema.  Skin:    General: Skin is warm and dry.     Findings: No rash.  Neurological:     Mental Status: She is alert and oriented to person, place, and time.  Psychiatric:        Behavior: Behavior normal.      ED Treatments / Results  Labs (all labs ordered are listed, but only abnormal results are displayed) Labs Reviewed  CBC WITH DIFFERENTIAL/PLATELET - Abnormal; Notable for the following components:      Result Value   MCV 100.7 (*)    All other components within normal limits  BASIC METABOLIC PANEL - Abnormal; Notable for the following components:   Creatinine, Ser 1.11 (*)    GFR calc non Af Amer 54 (*)    All other components within normal limits  NOVEL CORONAVIRUS, NAA (HOSP ORDER, SEND-OUT TO REF LAB; TAT 18-24 HRS)  D-DIMER, QUANTITATIVE (NOT AT [facility])  TROPONIN I (HIGH SENSITIVITY)  TROPONIN I (HIGH SENSITIVITY)    EKG None  Radiology Dg Chest Port 1 View  Result Date: 11/08/2019 CLINICAL DATA:  Chest pain. Additional history provided: Chest pain, shortness of breath, back pain. Cough for past 3 weeks productive of yellow sputum, runny nose. EXAM: PORTABLE CHEST 1 VIEW COMPARISON:  Chest radiograph 02/06/2011 FINDINGS: Heart size within normal limits.  Aortic atherosclerosis. Minimal non-specific interstitial prominence most notable at the right lung base. No airspace consolidation. No evidence of pleural effusion or pneumothorax. No acute bony abnormality. IMPRESSION: Minimal nonspecific interstitial prominence greatest at the right lung base. Atypical/viral pneumonia is difficult to exclude. No airspace  consolidation. Aortic atherosclerosis. Electronically Signed   By: Kellie Simmering DO   On: 11/08/2019 14:38    Procedures Procedures (including critical care time)  Medications Ordered in ED Medications  albuterol (VENTOLIN HFA) 108 (90 Base) MCG/ACT inhaler 2 puff (2 puffs Inhalation Given 11/08/19 1505)     Initial Impression / Assessment and Plan / ED Course  I have reviewed the triage vital signs and the nursing notes.  Pertinent labs & imaging results that were available during my care of the patient were reviewed by me and considered in my medical decision making (see chart for details).  Clinical Course as of Nov 07 1809  Wed Nov 08, 7235  8832 58 year old female presents with complaint of cough, productive with yellow sputum with pain in her chest.  Notes chest pain to be left and right, reproducible with palpation, also left trapezius with palpation.  On exam found to have wheezing, was given albuterol MDI  with improvement in symptoms.  Chest x-ray with concern for atypical pneumonia, Covid testing sent out has vitals are otherwise reassuring not requiring admission at this time.  CBC and BMP without significant findings, troponin x2 -, D-dimer negative.  Plan is for patient to quarantine at home awaiting test results, follow-up with PCP, return to ER for new or worsening symptoms, will discharge home on doxycycline for her atypical pneumonia.   [LM]    Clinical Course User Index [LM] Jeannie Fend, PA-C      Final Clinical Impressions(s) / ED Diagnoses   Final diagnoses:  Atypical pneumonia    ED Discharge Orders         Ordered    doxycycline (VIBRAMYCIN) 100 MG capsule  2 times daily     11/08/19 1803           Jeannie Fend, PA-C 11/08/19 1810    Vanetta Mulders, MD 11/11/19 1513

## 2019-11-08 NOTE — ED Notes (Signed)
Radiology at bedside

## 2019-11-08 NOTE — ED Triage Notes (Signed)
Pt c/o cough for the past three weeks that is productive with yellow sputum production, runny nose for the past week, c/o upper back pain with chest pain for the past few days,

## 2019-11-08 NOTE — ED Notes (Signed)
ekg given to Dr Rogene Houston,

## 2019-11-10 LAB — NOVEL CORONAVIRUS, NAA (HOSP ORDER, SEND-OUT TO REF LAB; TAT 18-24 HRS): SARS-CoV-2, NAA: NOT DETECTED

## 2020-03-08 ENCOUNTER — Inpatient Hospital Stay
Admission: RE | Admit: 2020-03-08 | Discharge: 2020-03-08 | Disposition: A | Discharge status: Home / Self Care | Payer: Medicare Other | Source: Ambulatory Visit

## 2021-06-07 LAB — EXTERNAL GENERIC LAB PROCEDURE: COLOGUARD: POSITIVE — AB

## 2021-06-07 LAB — COLOGUARD: COLOGUARD: POSITIVE — AB

## 2022-02-19 DIAGNOSIS — K5904 Chronic idiopathic constipation: Secondary | ICD-10-CM | POA: Insufficient documentation

## 2022-02-19 DIAGNOSIS — J449 Chronic obstructive pulmonary disease, unspecified: Secondary | ICD-10-CM | POA: Diagnosis present

## 2022-02-22 ENCOUNTER — Emergency Department (HOSPITAL_COMMUNITY): Payer: Medicare Other

## 2022-02-22 ENCOUNTER — Encounter (HOSPITAL_COMMUNITY): Payer: Self-pay | Admitting: Internal Medicine

## 2022-02-22 ENCOUNTER — Other Ambulatory Visit: Payer: Self-pay

## 2022-02-22 ENCOUNTER — Observation Stay (HOSPITAL_COMMUNITY)
Admission: EM | Admit: 2022-02-22 | Discharge: 2022-02-23 | Disposition: A | Discharge status: Home Health | Payer: Medicare Other | Attending: Internal Medicine | Admitting: Internal Medicine

## 2022-02-22 DIAGNOSIS — G459 Transient cerebral ischemic attack, unspecified: Principal | ICD-10-CM | POA: Insufficient documentation

## 2022-02-22 DIAGNOSIS — Z7982 Long term (current) use of aspirin: Secondary | ICD-10-CM | POA: Diagnosis not present

## 2022-02-22 DIAGNOSIS — R200 Anesthesia of skin: Secondary | ICD-10-CM | POA: Diagnosis present

## 2022-02-22 DIAGNOSIS — Z79899 Other long term (current) drug therapy: Secondary | ICD-10-CM | POA: Diagnosis not present

## 2022-02-22 DIAGNOSIS — Z7901 Long term (current) use of anticoagulants: Secondary | ICD-10-CM | POA: Insufficient documentation

## 2022-02-22 DIAGNOSIS — Z20822 Contact with and (suspected) exposure to covid-19: Secondary | ICD-10-CM | POA: Insufficient documentation

## 2022-02-22 DIAGNOSIS — E785 Hyperlipidemia, unspecified: Secondary | ICD-10-CM | POA: Insufficient documentation

## 2022-02-22 DIAGNOSIS — Z6841 Body Mass Index (BMI) 40.0 and over, adult: Secondary | ICD-10-CM | POA: Diagnosis not present

## 2022-02-22 DIAGNOSIS — F17210 Nicotine dependence, cigarettes, uncomplicated: Secondary | ICD-10-CM | POA: Diagnosis not present

## 2022-02-22 DIAGNOSIS — I10 Essential (primary) hypertension: Secondary | ICD-10-CM | POA: Insufficient documentation

## 2022-02-22 DIAGNOSIS — Z720 Tobacco use: Secondary | ICD-10-CM

## 2022-02-22 DIAGNOSIS — R41841 Cognitive communication deficit: Secondary | ICD-10-CM | POA: Diagnosis not present

## 2022-02-22 DIAGNOSIS — J449 Chronic obstructive pulmonary disease, unspecified: Secondary | ICD-10-CM | POA: Diagnosis not present

## 2022-02-22 HISTORY — DX: Chronic obstructive pulmonary disease, unspecified: J44.9

## 2022-02-22 LAB — I-STAT CHEM 8, ED
BUN: 13 mg/dL (ref 8–23)
Calcium, Ion: 1.08 mmol/L — ABNORMAL LOW (ref 1.15–1.40)
Chloride: 105 mmol/L (ref 98–111)
Creatinine, Ser: 1.3 mg/dL — ABNORMAL HIGH (ref 0.44–1.00)
Glucose, Bld: 108 mg/dL — ABNORMAL HIGH (ref 70–99)
HCT: 45 % (ref 36.0–46.0)
Hemoglobin: 15.3 g/dL — ABNORMAL HIGH (ref 12.0–15.0)
Potassium: 3.7 mmol/L (ref 3.5–5.1)
Sodium: 140 mmol/L (ref 135–145)
TCO2: 26 mmol/L (ref 22–32)

## 2022-02-22 LAB — DIFFERENTIAL
Abs Immature Granulocytes: 0.01 10*3/uL (ref 0.00–0.07)
Basophils Absolute: 0.1 10*3/uL (ref 0.0–0.1)
Basophils Relative: 1 %
Eosinophils Absolute: 0.3 10*3/uL (ref 0.0–0.5)
Eosinophils Relative: 5 %
Immature Granulocytes: 0 %
Lymphocytes Relative: 27 %
Lymphs Abs: 1.5 10*3/uL (ref 0.7–4.0)
Monocytes Absolute: 0.6 10*3/uL (ref 0.1–1.0)
Monocytes Relative: 11 %
Neutro Abs: 3.1 10*3/uL (ref 1.7–7.7)
Neutrophils Relative %: 56 %

## 2022-02-22 LAB — COMPREHENSIVE METABOLIC PANEL
ALT: 15 U/L (ref 0–44)
AST: 18 U/L (ref 15–41)
Albumin: 3.7 g/dL (ref 3.5–5.0)
Alkaline Phosphatase: 68 U/L (ref 38–126)
Anion gap: 11 (ref 5–15)
BUN: 12 mg/dL (ref 8–23)
CO2: 23 mmol/L (ref 22–32)
Calcium: 9.1 mg/dL (ref 8.9–10.3)
Chloride: 105 mmol/L (ref 98–111)
Creatinine, Ser: 1.36 mg/dL — ABNORMAL HIGH (ref 0.44–1.00)
GFR, Estimated: 44 mL/min — ABNORMAL LOW (ref >60)
Glucose, Bld: 111 mg/dL — ABNORMAL HIGH (ref 70–99)
Potassium: 3.6 mmol/L (ref 3.5–5.1)
Sodium: 139 mmol/L (ref 135–145)
Total Bilirubin: 0.8 mg/dL (ref 0.3–1.2)
Total Protein: 6.8 g/dL (ref 6.5–8.1)

## 2022-02-22 LAB — CBC
HCT: 44.4 % (ref 36.0–46.0)
Hemoglobin: 14.9 g/dL (ref 12.0–15.0)
MCH: 33.8 pg (ref 26.0–34.0)
MCHC: 33.6 g/dL (ref 30.0–36.0)
MCV: 100.7 fL — ABNORMAL HIGH (ref 80.0–100.0)
Platelets: 109 10*3/uL — ABNORMAL LOW (ref 150–400)
RBC: 4.41 MIL/uL (ref 3.87–5.11)
RDW: 12.4 % (ref 11.5–15.5)
WBC: 5.7 10*3/uL (ref 4.0–10.5)
nRBC: 0 % (ref 0.0–0.2)

## 2022-02-22 LAB — LIPID PANEL
Cholesterol: 188 mg/dL (ref 0–200)
HDL: 46 mg/dL (ref >40)
LDL Cholesterol: 118 mg/dL — ABNORMAL HIGH (ref 0–99)
Total CHOL/HDL Ratio: 4.1 RATIO
Triglycerides: 119 mg/dL (ref <150)
VLDL: 24 mg/dL (ref 0–40)

## 2022-02-22 LAB — CBG MONITORING, ED: Glucose-Capillary: 102 mg/dL — ABNORMAL HIGH (ref 70–99)

## 2022-02-22 LAB — RESP PANEL BY RT-PCR (FLU A&B, COVID) ARPGX2
Influenza A by PCR: NEGATIVE
Influenza B by PCR: NEGATIVE
SARS Coronavirus 2 by RT PCR: NEGATIVE

## 2022-02-22 LAB — HEMOGLOBIN A1C
Hgb A1c MFr Bld: 5.3 % (ref 4.8–5.6)
Mean Plasma Glucose: 105.41 mg/dL

## 2022-02-22 LAB — PROTIME-INR
INR: 1.1 (ref 0.8–1.2)
Prothrombin Time: 13.7 seconds (ref 11.4–15.2)

## 2022-02-22 LAB — APTT: aPTT: 33 seconds (ref 24–36)

## 2022-02-22 LAB — ETHANOL: Alcohol, Ethyl (B): <10 mg/dL (ref <10)

## 2022-02-22 IMAGING — CT CT HEAD CODE STROKE
3 series · 15 of 47 positions shown, 18 images · non-contrast
Comparison: [DATE]

CLINICAL DATA: Code stroke.  Right-sided numbness



[Series 3: head 5.0 h30s · axial · 0.44mm/px · z∈[-71,+59]mm · 9 of 32 slices shown, 12 images]
[im 3/32  brain]
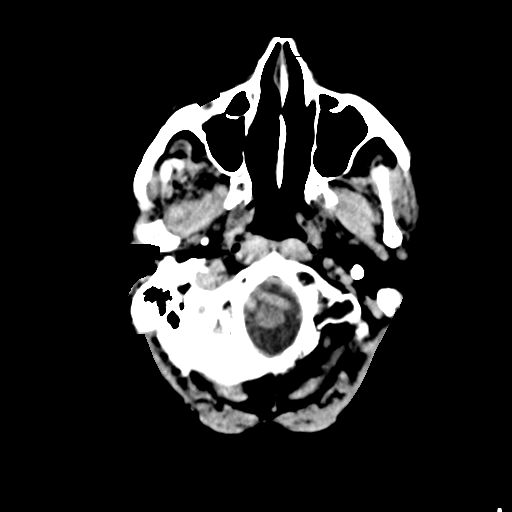
[im 3/32  bone]
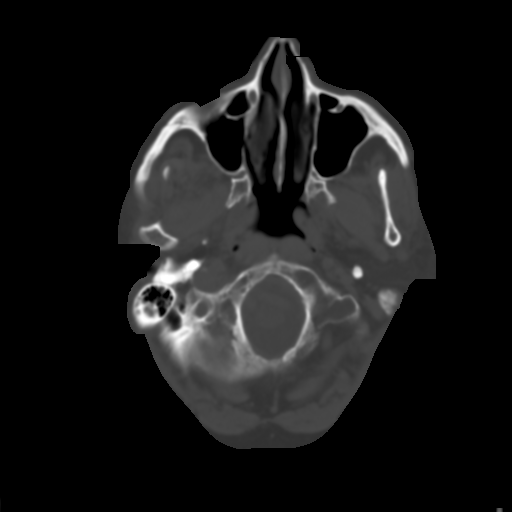
[im 6/32  brain]
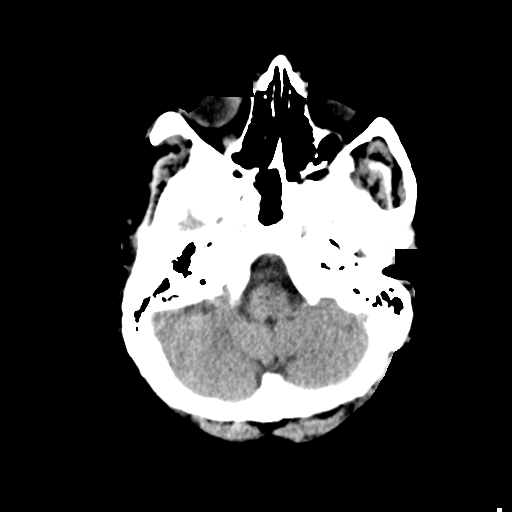
[im 9/32  brain]
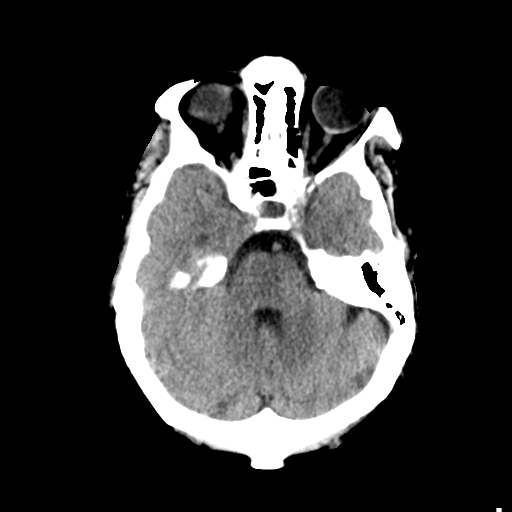
[im 12/32  brain]
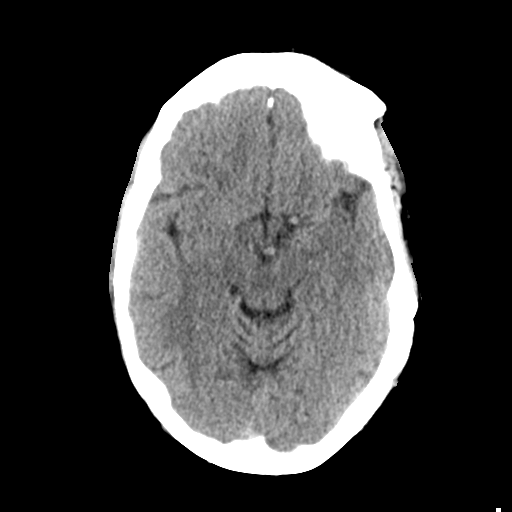
[im 17/32  brain]
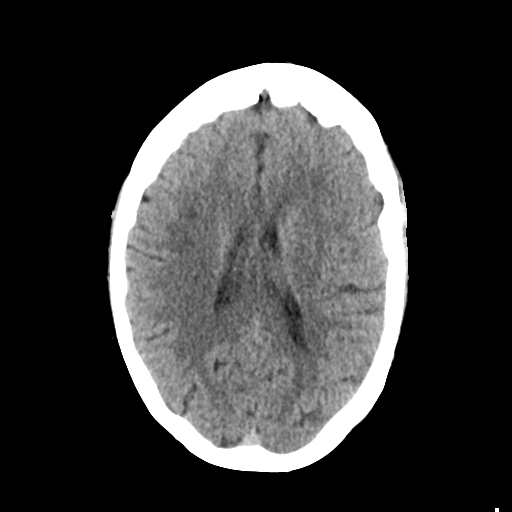
[im 17/32  bone]
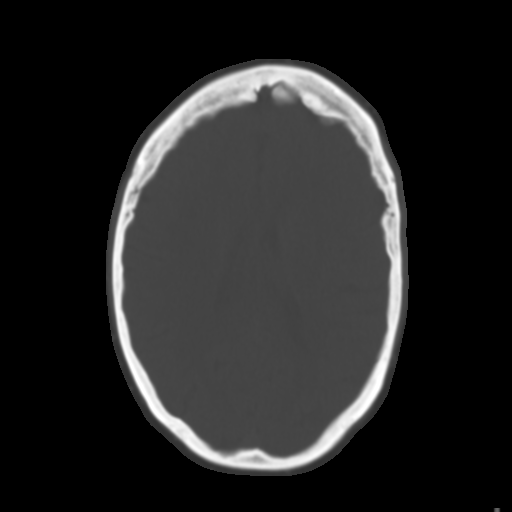
[im 20/32  brain]
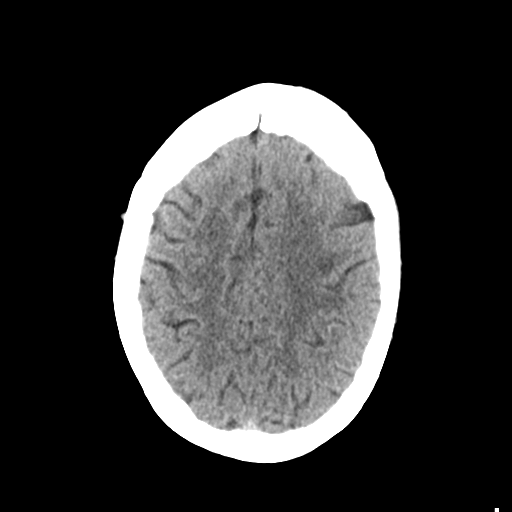
[im 23/32  brain]
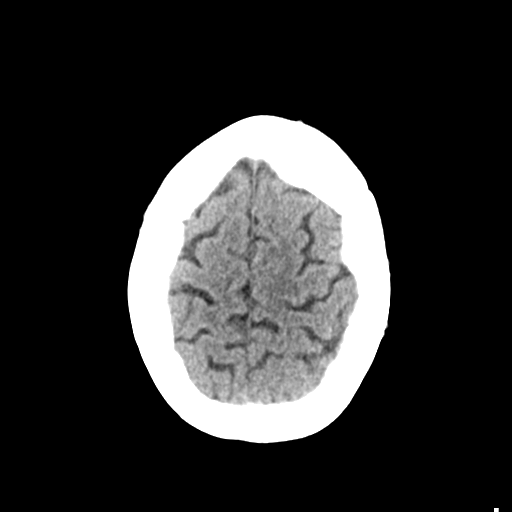
[im 26/32  brain]
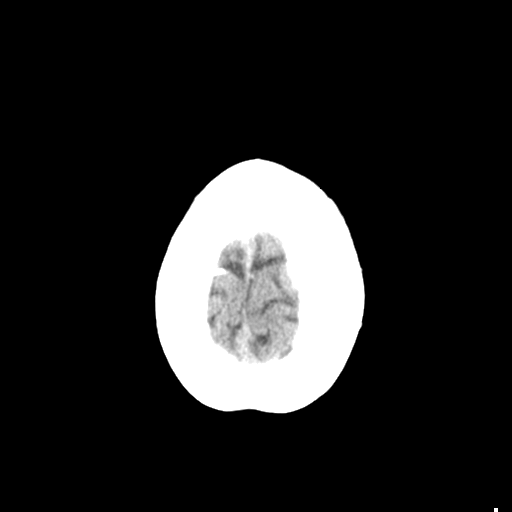
[im 29/32  brain]
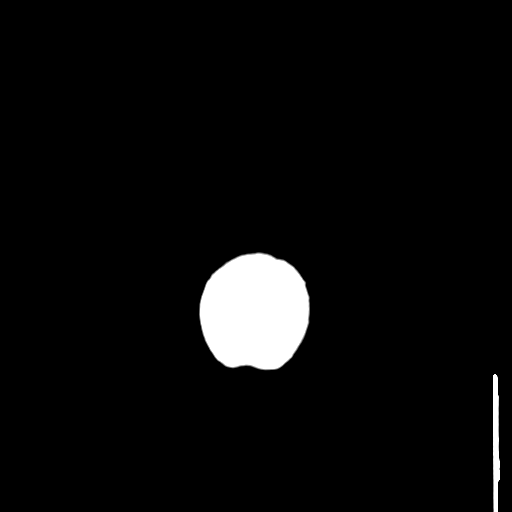
[im 29/32  bone]
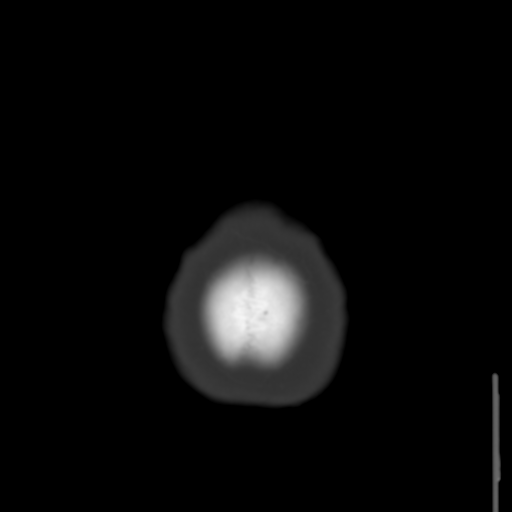

[Series 5: head 3.0 mpr cor · coronal · 0.30mm/px · 3 of 67 slices shown]
[im 23/67  brain]
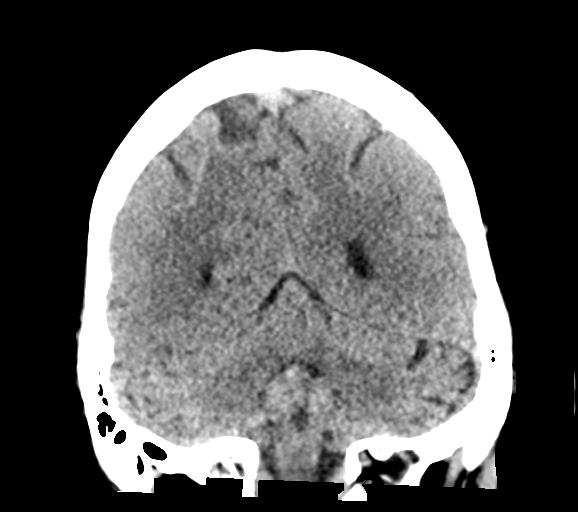
[im 30/67  brain]
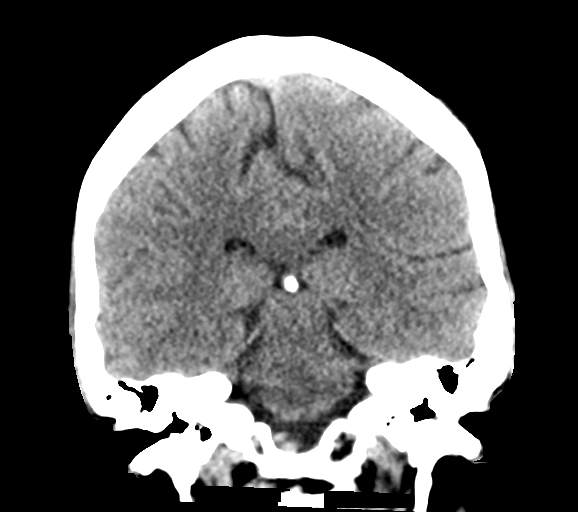
[im 37/67  brain]
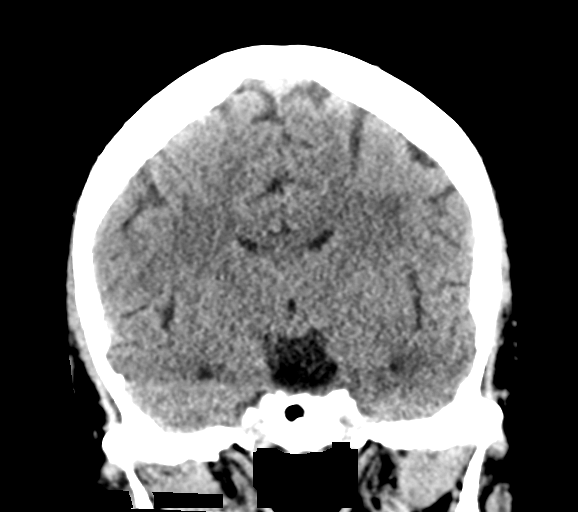

[Series 6: head 3.0 mpr sag · sagittal · 0.32mm/px · 3 of 55 slices shown]
[im 19/55  brain]
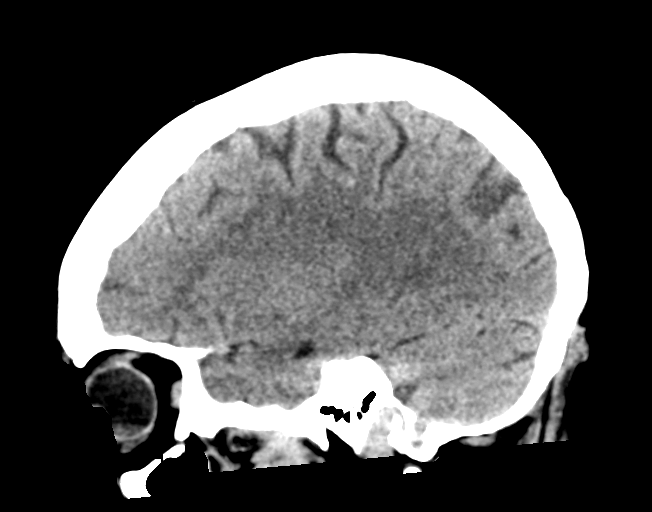
[im 28/55  brain]
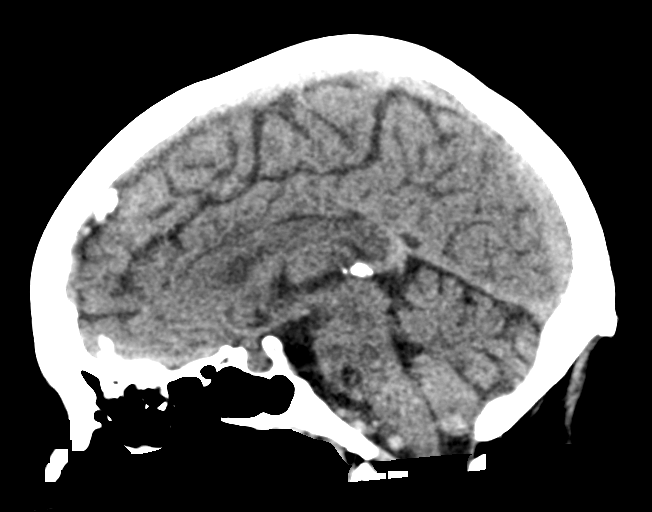
[im 37/55  brain]
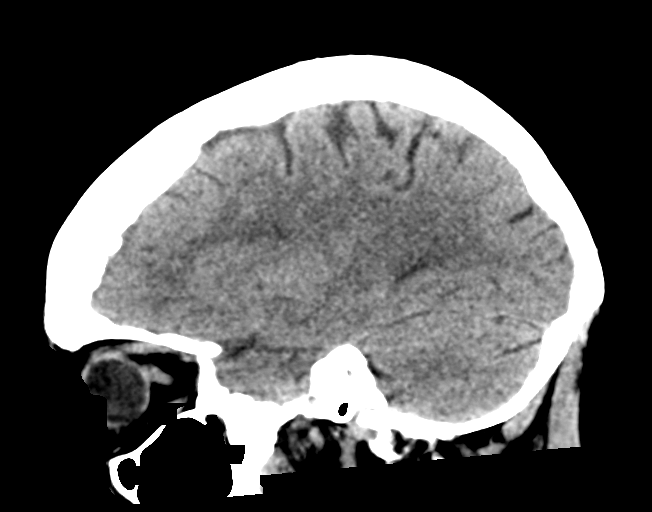

[15 of 47 positions shown; findings below may reference images not displayed]

FINDINGS: Brain: There is no mass, hemorrhage or extra-axial collection. The
size and configuration of the ventricles and extra-axial CSF spaces
are normal. The brain parenchyma is normal, without evidence of
acute or chronic infarction.

Vascular: No abnormal hyperdensity of the major intracranial
arteries or dural venous sinuses. No intracranial atherosclerosis.

Skull: The visualized skull base, calvarium and extracranial soft
tissues are normal.

Sinuses/Orbits: No fluid levels or advanced mucosal thickening of
the visualized paranasal sinuses. No mastoid or middle ear effusion.
The orbits are normal.

ASPECTS (Alberta Stroke Program Early CT Score)

- Ganglionic level infarction (caudate, lentiform nuclei, internal
capsule, insula, M1-M3 cortex): 7

- Supraganglionic infarction (M4-M6 cortex): 3

Total score (0-10 with 10 being normal): 10
IMPRESSION: 1. No acute intracranial abnormality.
2. ASPECTS is 10.

These results were communicated to Dr. ERNESTO RAUL at [DATE]
on [DATE] by text page via the [employer] messaging system.

## 2022-02-22 MED ORDER — STROKE: EARLY STAGES OF RECOVERY BOOK: Freq: Once | Status: DC

## 2022-02-22 MED ORDER — IPRATROPIUM-ALBUTEROL 0.5-2.5 (3) MG/3ML IN SOLN: 3.0000 mL | Freq: Four times a day (QID) | RESPIRATORY_TRACT | Status: DC | PRN

## 2022-02-22 MED ORDER — ACETAMINOPHEN 650 MG RE SUPP: 650.0000 mg | RECTAL | Status: DC | PRN

## 2022-02-22 MED ORDER — ACETAMINOPHEN 160 MG/5ML PO SOLN: 650.0000 mg | ORAL | Status: DC | PRN

## 2022-02-22 MED ORDER — ENOXAPARIN SODIUM 40 MG/0.4ML IJ SOSY
40.0000 mg | PREFILLED_SYRINGE | INTRAMUSCULAR | Status: DC
  Administered 2022-02-23: 40 mg via SUBCUTANEOUS
  Filled 2022-02-22: qty 0.4

## 2022-02-22 MED ORDER — ATORVASTATIN CALCIUM 40 MG PO TABS: 40.0000 mg | ORAL_TABLET | Freq: Every day | ORAL | Status: DC

## 2022-02-22 MED ORDER — ACETAMINOPHEN 325 MG PO TABS: 650.0000 mg | ORAL_TABLET | ORAL | Status: DC | PRN

## 2022-02-22 MED ORDER — ASPIRIN 325 MG PO TABS
325.0000 mg | ORAL_TABLET | Freq: Once | ORAL | Status: AC
  Administered 2022-02-22: 325 mg via ORAL
  Filled 2022-02-22: qty 1

## 2022-02-22 MED ORDER — CLOPIDOGREL BISULFATE 75 MG PO TABS
75.0000 mg | ORAL_TABLET | Freq: Every day | ORAL | Status: DC
Start: 2022-02-22 — End: 2022-02-23
  Administered 2022-02-22 – 2022-02-23 (×2): 75 mg via ORAL
  Filled 2022-02-22 (×2): qty 1

## 2022-02-22 MED ORDER — ASPIRIN EC 81 MG PO TBEC
81.0000 mg | DELAYED_RELEASE_TABLET | Freq: Every day | ORAL | Status: DC
  Administered 2022-02-23: 81 mg via ORAL
  Filled 2022-02-22: qty 1

## 2022-02-22 MED ORDER — MELATONIN 5 MG PO TABS
10.0000 mg | ORAL_TABLET | Freq: Every evening | ORAL | Status: DC | PRN
Start: 2022-02-22 — End: 2022-02-23
  Filled 2022-02-22: qty 2

## 2022-02-22 NOTE — Code Documentation (Signed)
Responded to Code Stroke called on pt already in ED for R arm and leg weakness and R sensory deficit. Code Stroke initiated on arrival to ED. LSN-1800, CBG-102, CT head negative for acute changes. TNK not given-mild symptoms. Pt remains in TNK window until 2230. Q25min VS and q36min neuro checks until then. Plan for stroke workup/admission by medical team. ?

## 2022-02-22 NOTE — Assessment & Plan Note (Addendum)
Home regimen includes lisinopril/HCTZ, metoprolol tartrate 25 mg p.o. twice daily.Marland Kitchen ?

## 2022-02-22 NOTE — Assessment & Plan Note (Addendum)
Oxygenating well on room air.  Discussed need for tobacco cessation. ?

## 2022-02-22 NOTE — Subjective & Objective (Signed)
CC: right side numbness, slurred speech ?HPI: ?18 white female with history of COPD, hypertension, hyperlipidemia, obesity, presented to the ER today with onset of slightly slurred speech, right arm and right leg numbness starting about 6:30 PM.  Patient states that she was sitting in her living room.  She was playing on her iPhone.  She noticed while she was looking down she had some tingling paresthesias of her right hand.  She thought this was due to her sitting down and looking at her phone for prolonged period time.  Then she noticed she started having some tingling in her right leg.  She told her daughter this.  She got up out of the couch.  She started walking around.  She noticed that she was having some slurred speech.  Pt states that her dtr noticed a slight droop of pt's face on the right side. ? ?Dtr urged pt to come to ER. ? ?Labs: ?WBC 5.7, HgB 14.9, platelets 109 ? ?CMP unremarkable except for scr 1.36, glucose 111 ? ?CT head negative. ?Pt has been seen by neurology ? ?TRH asked to admit patient. ?

## 2022-02-22 NOTE — ED Triage Notes (Signed)
Pt reported to ED with c/o numbness and tingling feeling to right side of face, rt arm and rt leg. Pt states that most symptoms resolved but she still feels tingling to face.   ?

## 2022-02-22 NOTE — Assessment & Plan Note (Addendum)
Patient presenting to the ED with acute onset right upper/lower extremity paresthesias, weakness with associated reported right-sided facial droop.  Risk factors include HTN, HLD, tobacco abuse.  CT head without contrast with no acute intracranial findings.  MR brain without contrast with no acute intracranial abnormality.  MRA head normal intracranial MRA.  Carotid Dopplers with bilateral ICA stenosis 1-39%, bilateral vertebral artery antegrade flow.  TTE with LVEF 60 to 65%, no interatrial shunt.  LDL 118, hemoglobin A1c 5.3.  Neurology was consulted and followed during hospital course.  Atorvastatin was increased to 80 mg p.o. daily.  We will continue dual antiplatelet therapy for suspected TIA with aspirin and Plavix followed by aspirin alone.  Seen by PT with recommendations of home health and rolling walker.  OT/SLP with no needs identified.  Will need outpatient follow-up with neurology 4 weeks.  Continue to encourage tobacco cessation. ?

## 2022-02-22 NOTE — Assessment & Plan Note (Addendum)
Lipid panel with total cholesterol 188, LDL 118, HDL 46, triglycerides 119. Home atorvastatin increased to 80 mg p.o. daily ?

## 2022-02-22 NOTE — Consult Note (Signed)
NEUROLOGY CONSULTATION NOTE   Date of service: February 22, 2022 Patient Name: Felicia Weeks MRN:  [id number] DOB:  12/09/1960 Reason for consult: "R sided numbness" Requesting Provider: Glendora Score, MD _ _ _   _ __   _ __ _ _  __ __   _ __   __ _  History of Present Illness  Felicia Weeks is a 61 y.o. female with PMH significant for HTN, DDD, smokes 1ppd who presents with numbness in her lips, R arm and R leg. Symptoms started sometime after 1800 today. She came in to the ED and a stroke code was activated.  No prior hx of similar symptoms, no headache, extensive family history of strokes.  CTH w/o contrast with no acute intracranial abnormalities.  LKW: 1800 mRS: 0 tNKASE: not offered, symptoms too mild at this time with only mild numbness in her R leg to touch. Thrombectomy: not offered, symptoms not consistent with an LVO NIHSS components Score: Comment  1a Level of Conscious 0[x]  1[]  2[]  3[]      1b LOC Questions 0[x]  1[]  2[]       1c LOC Commands 0[x]  1[]  2[]       2 Best Gaze 0[x]  1[]  2[]       3 Visual 0[x]  1[]  2[]  3[]      4 Facial Palsy 0[x]  1[]  2[]  3[]      5a Motor Arm - left 0[x]  1[]  2[]  3[]  4[]  UN[]    5b Motor Arm - Right 0[x]  1[]  2[]  3[]  4[]  UN[]    6a Motor Leg - Left 0[x]  1[]  2[]  3[]  4[]  UN[]    6b Motor Leg - Right 0[x]  1[]  2[]  3[]  4[]  UN[]    7 Limb Ataxia 0[x]  1[]  2[]  3[]  UN[]     8 Sensory 0[]  1[x]  2[]  UN[]      9 Best Language 0[x]  1[]  2[]  3[]      10 Dysarthria 0[x]  1[]  2[]  UN[]      11 Extinct. and Inattention 0[x]  1[]  2[]       TOTAL: 1      ROS   Constitutional Denies weight loss, fever and chills.   HEENT Denies changes in vision and hearing.   Respiratory Denies SOB and cough.   CV Denies palpitations and CP   GI Denies abdominal pain, nausea, vomiting and diarrhea.   GU Denies dysuria and urinary frequency.   MSK Denies myalgia and joint pain.   Skin Denies rash and pruritus.   Neurological Denies headache and syncope.   Psychiatric Denies recent  changes in mood. Denies anxiety and depression.    Past History   Past Medical History:  Diagnosis Date   DDD (degenerative disc disease)    Hypertension    Past Surgical History:  Procedure Laterality Date   BACK SURGERY     No family history on file. Social History   Socioeconomic History   Marital status: Single    Spouse name: Not on file   Number of children: Not on file   Years of education: Not on file   Highest education level: Not on file  Occupational History   Not on file  Tobacco Use   Smoking status: Every Day   Smokeless tobacco: Never  Substance and Sexual Activity   Alcohol use: No   Drug use: No   Sexual activity: Not on file  Other Topics Concern   Not on file  Social History Narrative   Not on file   Social Determinants of Health   Financial Resource Strain: Not on file  Food Insecurity: Not on file  Transportation Needs: Not on file  Physical Activity: Not on file  Stress: Not on file  Social Connections: Not on file   Allergies  Allergen Reactions   Benadryl [Diphenhydramine] Swelling   Vicodin [Hydrocodone-Acetaminophen] Swelling    Medications  (Not in a hospital admission)    Vitals   Vitals:   02/22/22 2004 02/22/22 2030 02/22/22 2045 02/22/22 2100  BP: (!) 234/87 (!) 211/58 (!) 187/50 (!) 177/60  Pulse: 99 76 (!) 57 62  Resp: 18 (!) 22    Temp: 97.9 F (36.6 C)     TempSrc: Oral     SpO2: 99% 100% 99% 100%     There is no height or weight on file to calculate BMI.  Physical Exam   General: Laying comfortably in bed; in no acute distress.  HENT: Normal oropharynx and mucosa. Normal external appearance of ears and nose.  Neck: Supple, no pain or tenderness  CV: No JVD. No peripheral edema.  Pulmonary: Symmetric Chest rise. Normal respiratory effort.  Abdomen: Soft to touch, non-tender.  Ext: No cyanosis, edema, or deformity  Skin: No rash. Normal palpation of skin.   Musculoskeletal: Normal digits  and nails by inspection. No clubbing.   Neurologic Examination  Mental status/Cognition: Alert, oriented to self, place, month and year, good attention.  Speech/language: Fluent, comprehension intact, object naming intact, repetition intact.  Cranial nerves:   CN II Pupils equal and reactive to light, no VF deficits    CN III,IV,VI EOM intact, no gaze preference or deviation, no nystagmus    CN V normal sensation in V1, V2, and V3 segments bilaterally    CN VII no asymmetry, no nasolabial fold flattening    CN VIII normal hearing to speech    CN IX & X normal palatal elevation, no uvular deviation    CN XI 5/5 head turn and 5/5 shoulder shrug bilaterally    CN XII midline tongue protrusion    Motor:  Muscle bulk: normal, tone normal, pronator drift none tremor none Mvmt Root Nerve  Muscle Right Left Comments  SA C5/6 Ax Deltoid 5 5   EF C5/6 Mc Biceps 5 5   EE C6/7/8 Rad Triceps 5 5   WF C6/7 Med FCR     WE C7/8 PIN ECU     F Ab C8/T1 U ADM/FDI 5 5   HF L1/2/3 Fem Illopsoas 5 5   KE L2/3/4 Fem Quad 5 5   DF L4/5 D Peron Tib Ant 5 5   PF S1/2 Tibial Grc/Sol 5 5    Reflexes:  Right Left Comments  Pectoralis      Biceps (C5/6) 2 2   Brachioradialis (C5/6) 2 2    Triceps (C6/7) 2 2    Patellar (L3/4) 2 2    Achilles (S1)      Hoffman      Plantar     Jaw jerk    Sensation:  Light touch Slightly decreased to touch in L leg, L foot.   Pin prick    Temperature    Vibration   Proprioception    Coordination/Complex Motor:  - Finger to Nose intact BL - Heel to shin unable to do 2/2 body habitus. - Rapid alternating movement intact BL - Gait: Stride length normal. Arm swing poor. Base width narrow.  Labs   CBC:  Recent Labs  Lab 02/22/22 2017 02/22/22 2022  WBC 5.7  --   NEUTROABS 3.1  --  HGB 14.9 15.3*  HCT 44.4 45.0  MCV 100.7*  --   PLT 109*  --     Basic Metabolic Panel:  Lab Results  Component Value Date   NA 140 02/22/2022   K 3.7 02/22/2022    CO2 23 02/22/2022   GLUCOSE 108 (H) 02/22/2022   BUN 13 02/22/2022   CREATININE 1.30 (H) 02/22/2022   CALCIUM 9.1 02/22/2022   GFRNONAA 44 (L) 02/22/2022   GFRAA >60 11/08/2019   Lipid Panel:  Lab Results  Component Value Date   LDLCALC 158 (H) 12/10/2016   HgbA1c: No results found for: HGBA1C Urine Drug Screen: No results found for: LABOPIA, COCAINSCRNUR, LABBENZ, AMPHETMU, THCU, LABBARB  Alcohol Level No results found for: ETH  CT Head without contrast(Personally reviewed): CTH was negative for a large hypodensity concerning for a large territory infarct or hyperdensity concerning for an ICH  MR Angio head without contrast and Carotid Duplex BL: pending  MRI Brain: pending   Impression   Felicia Weeks is a 61 y.o. female with PMH significant for HTN, DDD, smokes 1ppd who presents with numbness in her lips, R arm and R leg. Symptoms started sometime after 1800 today. Her neurologic examination is notable for slight decrease in touch in her R leg. Will closely monitor her while in the tNKASE window for any worsening of her symptoms.  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  Recommendations   - Frequent Neuro checks per stroke unit protocol - Recommend brain imaging with MRI Brain without contrast - Recommend Vascular imaging with MRA Angio Head without contrast and US Carotid doppler - Recommend obtaining TTE - Recommend obtaining Lipid panel with LDL - Please start statin if LDL > 70 - Recommend HbA1c - Antithrombotic - Aspirin 324 now followed by Aspirin 81mg  daily starting tomorrow. Will also do Plavix 75mg  daily x 21 days. - Recommend DVT ppx - SBP goal - permissive hypertension first 24 h < 220/110. Held home meds.  - Recommend Telemetry monitoring for arrythmia - Recommend bedside swallow screen prior to PO intake. - Stroke education booklet - Recommend PT/OT/SLP consult - Recommend Urine Tox screen. - counseled on the importance of quitting smoking to  reduce risk of stroke in the future. - closely monitor with Q51min Neuro checks while within the tNKASE window.  ______________________________________________________________________   Thank you for the opportunity to take part in the care of this patient. If you have any further questions, please contact the neurology consultation attending.  Signed,  Erick Blinks [facility] Pager Number [phone] _ _ _   _ __   _ __ _ _  __ __   _ __   __ _

## 2022-02-22 NOTE — ED Notes (Signed)
Pt ambulated to bathroom 

## 2022-02-22 NOTE — ED Provider Triage Note (Signed)
Emergency Medicine Provider Triage Evaluation Note ? ?Felicia Weeks , a 61 y.o. female  was evaluated in triage.  Pt complains of right arm and leg weakness and numbness.  This started at 1830 tonight.  She states that her family reports she was slurring her speech however that is improved.  She still feels weak in the right arm and leg and reports numbness in the right side of her face. ? ? ?Physical Exam  ?BP (!) 234/87 (BP Location: Right Arm)   Pulse 99   Temp 97.9 ?F (36.6 ?C) (Oral)   Resp 18   SpO2 99%  ?Gen:   Awake, no distress   ?Resp:  Normal effort  ?MSK:   Right-sided mild pronator drift.  Right grip strength 4/5, right ankle dorsiflexion and plantarflexion is 4/5, left side grip strength and ankle dorsiflexion and plantarflexion is 5/5.   ?Other:  Smile is symmetric ? ?Medical Decision Making  ?Medically screening exam initiated at 8:05 PM.  Appropriate orders placed.  Felicia Weeks was informed that the remainder of the evaluation will be completed by another provider, this initial triage assessment does not replace that evaluation, and the importance of remaining in the ED until their evaluation is complete. ? ?Patient is acute neurodeficits.  While it does not sound like her slurred speech is improving she still has ongoing deficits.  Code stroke was activated.  Charge and triage RN are aware.  Patient's airway is intact and she is cleared for CT scan.  Orders are placed. ?  ?Cristina Gong, PA-C ?02/22/22 2007 ? ?

## 2022-02-22 NOTE — Assessment & Plan Note (Addendum)
Counseled on need for complete cessation. Declined nicotine patch while inpatient. ?

## 2022-02-22 NOTE — ED Provider Notes (Signed)
?[facility] ?Provider Note ? ?CSN: [id number] ?Arrival date & time: 02/22/22 1950 ? ?Chief Complaint(s) ?Numbness and Code Stroke ? ?HPI ?Felicia Weeks is a 61 y.o. female with PMH COPD, HTN, HLD, obesity who presents emergency department for evaluation of right arm and right leg numbness.  Last known well 6:30 PM.  Denies chest pain, shortness of breath, abdominal pain, nausea, vomiting or other systemic symptoms.  Stroke alert called. ? ?HPI ? ?Past Medical History ?Past Medical History:  ?Diagnosis Date  ? DDD (degenerative disc disease)   ? Hypertension   ? ?Patient Active Problem List  ? Diagnosis Date Noted  ? Hyperlipidemia 12/11/2016  ? Morbid obesity ([facility]) 12/10/2016  ? Essential hypertension 12/10/2016  ? ?Home Medication(s) ?Prior to Admission medications   ?Medication Sig Start Date End Date Taking? Authorizing Provider  ?atorvastatin (LIPITOR) 40 MG tablet Take 1 tablet (40 mg total) by mouth daily. 12/11/16   Hassell Done Mary-Margaret, FNP  ?doxycycline (VIBRAMYCIN) 100 MG capsule Take 1 capsule (100 mg total) by mouth 2 (two) times daily. 11/08/19   Tacy Learn, PA-C  ?lisinopril-hydrochlorothiazide (ZESTORETIC) 20-12.5 MG tablet Take 1 tablet by mouth daily. 12/10/16   Chevis Pretty, FNP  ?metoprolol tartrate (LOPRESSOR) 25 MG tablet Take 1 tablet (25 mg total) by mouth 2 (two) times daily. 08/31/16   Chevis Pretty, FNP  ?                                                                                                                                  ?Past Surgical History ?Past Surgical History:  ?Procedure Laterality Date  ? BACK SURGERY    ? ?Family History ?No family history on file. ? ?Social History ?Social History  ? ?Tobacco Use  ? Smoking status: Every Day  ? Smokeless tobacco: Never  ?Substance Use Topics  ? Alcohol use: No  ? Drug use: No  ? ?Allergies ?Benadryl [diphenhydramine] and Vicodin [hydrocodone-acetaminophen] ? ?Review of  Systems ?Review of Systems  ?Neurological:  Positive for numbness.  ? ?Physical Exam ?Vital Signs  ?I have reviewed the triage vital signs ?BP (!) 177/60   Pulse 62   Temp 97.9 ?F (36.6 ?C) (Oral)   Resp (!) 22   SpO2 100%  ? ?Physical Exam ?Vitals and nursing note reviewed.  ?Constitutional:   ?   General: She is not in acute distress. ?   Appearance: She is well-developed.  ?HENT:  ?   Head: Normocephalic and atraumatic.  ?Eyes:  ?   Conjunctiva/sclera: Conjunctivae normal.  ?Cardiovascular:  ?   Rate and Rhythm: Normal rate and regular rhythm.  ?   Heart sounds: No murmur heard. ?Pulmonary:  ?   Effort: Pulmonary effort is normal. No respiratory distress.  ?   Breath sounds: Normal breath sounds.  ?Abdominal:  ?   Palpations: Abdomen is soft.  ?   Tenderness: There is no abdominal tenderness.  ?  Musculoskeletal:     ?   General: No swelling.  ?   Cervical back: Neck supple.  ?Skin: ?   General: Skin is warm and dry.  ?   Capillary Refill: Capillary refill takes less than 2 seconds.  ?Neurological:  ?   Mental Status: She is alert.  ?   Cranial Nerves: No cranial nerve deficit.  ?   Sensory: Sensory deficit present.  ?   Motor: No weakness.  ?Psychiatric:     ?   Mood and Affect: Mood normal.  ? ? ?ED Results and Treatments ?Labs ?(all labs ordered are listed, but only abnormal results are displayed) ?Labs Reviewed  ?CBC - Abnormal; Notable for the following components:  ?    Result Value  ? MCV 100.7 (*)   ? Platelets 109 (*)   ? All other components within normal limits  ?COMPREHENSIVE METABOLIC PANEL - Abnormal; Notable for the following components:  ? Glucose, Bld 111 (*)   ? Creatinine, Ser 1.36 (*)   ? GFR, Estimated 44 (*)   ? All other components within normal limits  ?I-STAT CHEM 8, ED - Abnormal; Notable for the following components:  ? Creatinine, Ser 1.30 (*)   ? Glucose, Bld 108 (*)   ? Calcium, Ion 1.08 (*)   ? Hemoglobin 15.3 (*)   ? All other components within normal limits  ?CBG MONITORING, ED  - Abnormal; Notable for the following components:  ? Glucose-Capillary 102 (*)   ? All other components within normal limits  ?RESP PANEL BY RT-PCR (FLU A&B, COVID) ARPGX2  ?PROTIME-INR  ?APTT  ?DIFFERENTIAL  ?ETHANOL  ?RAPID URINE DRUG SCREEN, HOSP PERFORMED  ?URINALYSIS, ROUTINE W REFLEX MICROSCOPIC  ?                                                                                                                       ? ?Radiology ?CT HEAD CODE STROKE WO CONTRAST ? ?Result Date: 02/22/2022 ?CLINICAL DATA:  Code stroke.  Right-sided numbness EXAM: CT HEAD WITHOUT CONTRAST TECHNIQUE: Contiguous axial images were obtained from the base of the skull through the vertex without intravenous contrast. RADIATION DOSE REDUCTION: This exam was performed according to the departmental dose-optimization program which includes automated exposure control, adjustment of the mA and/or kV according to patient size and/or use of iterative reconstruction technique. COMPARISON:  06/27/2009 FINDINGS: Brain: There is no mass, hemorrhage or extra-axial collection. The size and configuration of the ventricles and extra-axial CSF spaces are normal. The brain parenchyma is normal, without evidence of acute or chronic infarction. Vascular: No abnormal hyperdensity of the major intracranial arteries or dural venous sinuses. No intracranial atherosclerosis. Skull: The visualized skull base, calvarium and extracranial soft tissues are normal. Sinuses/Orbits: No fluid levels or advanced mucosal thickening of the visualized paranasal sinuses. No mastoid or middle ear effusion. The orbits are normal. ASPECTS [facility] Stroke Program Early CT Score) - Ganglionic level infarction (caudate, lentiform nuclei, internal capsule, insula, M1-M3 cortex): 7 -  Supraganglionic infarction (M4-M6 cortex): 3 Total score (0-10 with 10 being normal): 10 IMPRESSION: 1. No acute intracranial abnormality. 2. ASPECTS is 10. These results were communicated to Dr. Donnetta Simpers at 8:16 pm on 02/22/2022 by text page via the [facility] messaging system. Electronically Signed   By: Ulyses Jarred M.D.   On: 02/22/2022 20:16   ? ?Pertinent labs & imaging results that were available during my care of the patient were reviewed by me and considered in my medical decision making (see MDM for details). ? ?Medications Ordered in ED ?Medications  ?aspirin tablet 325 mg (325 mg Oral Given 02/22/22 2029)  ?                                                               ?                                                                    ?Procedures ?Marland KitchenCritical Care ?Performed by: Teressa Lower, MD ?Authorized by: Teressa Lower, MD  ? ?Critical care provider statement:  ?  Critical care time (minutes):  30 ?  Critical care was necessary to treat or prevent imminent or life-threatening deterioration of the following conditions: CVA. ?  Critical care was time spent personally by me on the following activities:  Development of treatment plan with patient or surrogate, discussions with consultants, evaluation of patient's response to treatment, examination of patient, ordering and review of laboratory studies, ordering and review of radiographic studies, ordering and performing treatments and interventions, pulse oximetry, re-evaluation of patient's condition and review of old charts ? ?(including critical care time) ? ?Medical Decision Making / ED Course ? ? ?This patient presents to the ED for concern of numbness, this involves an extensive number of treatment options, and is a complaint that carries with it a high risk of complications and morbidity.  The differential diagnosis includes CVA, TIA, electrolyte abnormality, psychosomatic ? ?MDM: ?Patient seen the emergency room for evaluation of numbness.  Stroke alert called as the patient is within the window.  Initial stroke imaging negative.  Laboratory evaluation with a creatinine of 1.3 but is otherwise unremarkable.  COVID and flu negative.   Neurology recommending MRI and admission for completion of stroke versus TIA work-up.  Patient then admitted. ? ? ?Additional history obtained: ?-Additional history obtained from daughter ?-External records from [employer]

## 2022-02-22 NOTE — H&P (Addendum)
?History and Physical  ? ? Felicia Weeks NHA:[id number] DOB: 02-20-1961 DOA: 02/22/2022 ? ?DOS: the patient was seen and examined on 02/22/2022 ? ?PCP: April Manson, NP  ? ?Patient coming from: Home ? ?I have personally briefly reviewed patient's old medical records in [facility] ? ?CC: right side numbness, slurred speech ?HPI: ?86 white female with history of COPD, hypertension, hyperlipidemia, obesity, presented to the ER today with onset of slightly slurred speech, right arm and right leg numbness starting about 6:30 PM.  Patient states that she was sitting in her living room.  She was playing on her iPhone.  She noticed while she was looking down she had some tingling paresthesias of her right hand.  She thought this was due to her sitting down and looking at her phone for prolonged period time.  Then she noticed she started having some tingling in her right leg.  She told her daughter this.  She got up out of the couch.  She started walking around.  She noticed that she was having some slurred speech.  Pt states that her dtr noticed a slight droop of pt's face on the right side. ? ?Dtr urged pt to come to ER. ? ?Labs: ?WBC 5.7, HgB 14.9, platelets 109 ? ?CMP unremarkable except for scr 1.36, glucose 111 ? ?CT head negative. ?Pt has been seen by neurology ? ?TR[facility] asked to admit patient.  ? ?ED Course: labs and imaging largely unremarkable. ? ?Review of Systems:  ?Review of Systems  ?Constitutional: Negative.   ?HENT: Negative.    ?Eyes: Negative.   ?Respiratory: Negative.    ?Cardiovascular: Negative.   ?Gastrointestinal: Negative.   ?Genitourinary: Negative.   ?Musculoskeletal: Negative.   ?Skin: Negative.   ?Neurological:  Positive for tingling and speech change.  ?Endo/Heme/Allergies: Negative.   ?Psychiatric/Behavioral: Negative.    ?All other systems reviewed and are negative. ? ?Past Medical History:  ?Diagnosis Date  ? COPD (chronic obstructive pulmonary disease) (HCC)   ? DDD (degenerative disc  disease)   ? Hypertension   ? ? ?Past Surgical History:  ?Procedure Laterality Date  ? BACK SURGERY    ? ? ? reports that she has been smoking cigarettes. She started smoking about 41 years ago. She has been smoking an average of 1 pack per day. She has never used smokeless tobacco. She reports that she does not drink alcohol and does not use drugs. ? ?Allergies  ?Allergen Reactions  ? Benadryl [Diphenhydramine] Swelling  ? Vicodin [Hydrocodone-Acetaminophen] Swelling  ? ? ?No family history on file. ? ?Prior to Admission medications   ?Medication Sig Start Date End Date Taking? Authorizing Provider  ?atorvastatin (LIPITOR) 40 MG tablet Take 1 tablet (40 mg total) by mouth daily. 12/11/16   Daphine Deutscher Mary-Margaret, FNP  ?doxycycline (VIBRAMYCIN) 100 MG capsule Take 1 capsule (100 mg total) by mouth 2 (two) times daily. 11/08/19   Jeannie Fend, PA-C  ?lisinopril-hydrochlorothiazide (ZESTORETIC) 20-12.5 MG tablet Take 1 tablet by mouth daily. 12/10/16   Bennie Pierini, FNP  ?metoprolol tartrate (LOPRESSOR) 25 MG tablet Take 1 tablet (25 mg total) by mouth 2 (two) times daily. 08/31/16   Bennie Pierini, FNP  ? ? ?Physical Exam: ?Vitals:  ? 02/22/22 2045 02/22/22 2100 02/22/22 2115 02/22/22 2130  ?BP: (!) 187/50 (!) 177/60 (!) 176/49 (!) 176/89  ?Pulse: (!) 57 62 (!) 58 69  ?Resp:   20   ?Temp:      ?TempSrc:      ?SpO2: 99% 100%  99% 99%  ? ? ?Physical Exam ?Vitals and nursing note reviewed.  ?Constitutional:   ?   General: She is not in acute distress. ?   Appearance: Normal appearance. She is obese. She is not ill-appearing, toxic-appearing or diaphoretic.  ?HENT:  ?   Head: Normocephalic and atraumatic.  ?   Nose: Nose normal. No rhinorrhea.  ?Cardiovascular:  ?   Rate and Rhythm: Normal rate and regular rhythm.  ?   Pulses: Normal pulses.  ?Pulmonary:  ?   Effort: No respiratory distress.  ?   Comments: Scattered wheezes ?Abdominal:  ?   General: Abdomen is protuberant. Bowel sounds are normal.  ?    Palpations: Abdomen is soft.  ?   Tenderness: There is no abdominal tenderness. There is no guarding or rebound.  ?Musculoskeletal:  ?   Right lower leg: No edema.  ?   Left lower leg: No edema.  ?Skin: ?   General: Skin is warm and dry.  ?   Capillary Refill: Capillary refill takes less than 2 seconds.  ?Neurological:  ?   General: No focal deficit present.  ?   Mental Status: She is alert and oriented to person, place, and time.  ?  ? ?Labs on Admission: I have personally reviewed following labs and imaging studies ? ?CBC: ?Recent Labs  ?Lab 02/22/22 ?2017 02/22/22 ?2022  ?WBC 5.7  --   ?NEUTROABS 3.1  --   ?HGB 14.9 15.3*  ?HCT 44.4 45.0  ?MCV 100.7*  --   ?PLT 109*  --   ? ?Basic Metabolic Panel: ?Recent Labs  ?Lab 02/22/22 ?2017 02/22/22 ?2022  ?NA 139 140  ?K 3.6 3.7  ?CL 105 105  ?CO2 23  --   ?GLUCOSE 111* 108*  ?BUN 12 13  ?CREATININE 1.36* 1.30*  ?CALCIUM 9.1  --   ? ?GFR: ?CrCl cannot be calculated (Unknown ideal weight.). ?Liver Function Tests: ?Recent Labs  ?Lab 02/22/22 ?2017  ?AST 18  ?ALT 15  ?ALKPHOS 68  ?BILITOT 0.8  ?PROT 6.8  ?ALBUMIN 3.7  ? ?No results for input(s): LIPASE, AMYLASE in the last 168 hours. ?No results for input(s): AMMONIA in the last 168 hours. ?Coagulation Profile: ?Recent Labs  ?Lab 02/22/22 ?2017  ?INR 1.1  ? ?Cardiac Enzymes: ?No results for input(s): CKTOTAL, CKMB, CKMBINDEX, TROPONINI, TROPONINIHS in the last 168 hours. ?BNP (last 3 results) ?No results for input(s): PROBNP in the last 8760 hours. ?HbA1C: ?No results for input(s): HGBA1C in the last 72 hours. ?CBG: ?Recent Labs  ?Lab 02/22/22 ?2003  ?GLUCAP 102*  ? ?Lipid Profile: ?No results for input(s): CHOL, HDL, LDLCALC, TRIG, CHOLHDL, LDLDIRECT in the last 72 hours. ?Thyroid Function Tests: ?No results for input(s): TSH, T4TOTAL, FREET4, T3FREE, THYROIDAB in the last 72 hours. ?Anemia Panel: ?No results for input(s): VITAMINB12, FOLATE, FERRITIN, TIBC, IRON, RETICCTPCT in the last 72 hours. ?Urine analysis: ?    ?Component Value Date/Time  ? COLORURINE YELLOW 04/07/2010 1300  ? APPEARANCEUR CLEAR 04/07/2010 1300  ? LABSPEC 1.010 04/07/2010 1300  ? PHURINE 6.5 04/07/2010 1300  ? GLUCOSEU NEGATIVE 04/07/2010 1300  ? HGBUR LARGE (A) 04/07/2010 1300  ? BILIRUBINUR NEGATIVE 04/07/2010 1300  ? KETONESUR NEGATIVE 04/07/2010 1300  ? PROTEINUR NEGATIVE 04/07/2010 1300  ? UROBILINOGEN 0.2 04/07/2010 1300  ? NITRITE NEGATIVE 04/07/2010 1300  ? LEUKOCYTESUR NEGATIVE 04/07/2010 1300  ? ? ?Radiological Exams on Admission: I have personally reviewed images ?CT HEAD CODE STROKE WO CONTRAST ? ?Result Date: 02/22/2022 ?CLINICAL DATA:  Code  stroke.  Right-sided numbness EXAM: CT HEAD WITHOUT CONTRAST TECHNIQUE: Contiguous axial images were obtained from the base of the skull through the vertex without intravenous contrast. RADIATION DOSE REDUCTION: This exam was performed according to the departmental dose-optimization program which includes automated exposure control, adjustment of the mA and/or kV according to patient size and/or use of iterative reconstruction technique. COMPARISON:  06/27/2009 FINDINGS: Brain: There is no mass, hemorrhage or extra-axial collection. The size and configuration of the ventricles and extra-axial CSF spaces are normal. The brain parenchyma is normal, without evidence of acute or chronic infarction. Vascular: No abnormal hyperdensity of the major intracranial arteries or dural venous sinuses. No intracranial atherosclerosis. Skull: The visualized skull base, calvarium and extracranial soft tissues are normal. Sinuses/Orbits: No fluid levels or advanced mucosal thickening of the visualized paranasal sinuses. No mastoid or middle ear effusion. The orbits are normal. ASPECTS [facility]([place] Stroke Program Early CT Score) - Ganglionic level infarction (caudate, lentiform nuclei, internal capsule, insula, M1-M3 cortex): 7 - Supraganglionic infarction (M4-M6 cortex): 3 Total score (0-10 with 10 being normal): 10 IMPRESSION:  1. No acute intracranial abnormality. 2. ASPECTS is 10. These results were communicated to Dr. Erick BlinksSalman Khaliqdina at 8:16 pm on 02/22/2022 by text page via the [facility] messaging system. Electronically Signed   By: Debroah LoopKevi

## 2022-02-22 NOTE — Assessment & Plan Note (Signed)
Chronic. 

## 2022-02-22 NOTE — Assessment & Plan Note (Addendum)
Discussed with patient needs for aggressive lifestyle changes/weight loss as this complicates all facets of care.  Outpatient follow-up with PCP.  ?

## 2022-02-23 ENCOUNTER — Observation Stay (HOSPITAL_BASED_OUTPATIENT_CLINIC_OR_DEPARTMENT_OTHER): Payer: Medicare Other

## 2022-02-23 ENCOUNTER — Observation Stay (HOSPITAL_COMMUNITY): Payer: Medicare Other

## 2022-02-23 DIAGNOSIS — J449 Chronic obstructive pulmonary disease, unspecified: Secondary | ICD-10-CM | POA: Diagnosis not present

## 2022-02-23 DIAGNOSIS — G459 Transient cerebral ischemic attack, unspecified: Secondary | ICD-10-CM | POA: Diagnosis not present

## 2022-02-23 DIAGNOSIS — E785 Hyperlipidemia, unspecified: Secondary | ICD-10-CM

## 2022-02-23 DIAGNOSIS — Z720 Tobacco use: Secondary | ICD-10-CM | POA: Diagnosis not present

## 2022-02-23 DIAGNOSIS — I10 Essential (primary) hypertension: Secondary | ICD-10-CM

## 2022-02-23 LAB — URINALYSIS, ROUTINE W REFLEX MICROSCOPIC
Bilirubin Urine: NEGATIVE
Glucose, UA: NEGATIVE mg/dL
Hgb urine dipstick: NEGATIVE
Ketones, ur: NEGATIVE mg/dL
Leukocytes,Ua: NEGATIVE
Nitrite: NEGATIVE
Protein, ur: NEGATIVE mg/dL
Specific Gravity, Urine: 1.005 (ref 1.005–1.030)
pH: 6 (ref 5.0–8.0)

## 2022-02-23 LAB — ECHOCARDIOGRAM COMPLETE
AR max vel: 2.4 cm2
AV Area VTI: 2.49 cm2
AV Area mean vel: 2.19 cm2
AV Mean grad: 10 mmHg
AV Peak grad: 19.2 mmHg
Ao pk vel: 2.19 m/s
Area-P 1/2: 3.48 cm2
Calc EF: 72.3 %
P 1/2 time: 599 msec
Radius: 0.4 cm
S' Lateral: 3.1 cm
Single Plane A2C EF: 67.1 %
Single Plane A4C EF: 76.4 %

## 2022-02-23 LAB — RAPID URINE DRUG SCREEN, HOSP PERFORMED
Amphetamines: NOT DETECTED
Barbiturates: NOT DETECTED
Benzodiazepines: NOT DETECTED
Cocaine: NOT DETECTED
Opiates: NOT DETECTED
Tetrahydrocannabinol: NOT DETECTED

## 2022-02-23 LAB — HIV ANTIBODY (ROUTINE TESTING W REFLEX): HIV Screen 4th Generation wRfx: NONREACTIVE

## 2022-02-23 IMAGING — MR MR HEAD W/O CM
10 of 11 series · 42 of 48 positions shown · non-contrast
Comparison: No pertinent prior exam.

CLINICAL DATA: Transient ischemic attack

EXAM:
MRI HEAD WITHOUT CONTRAST
MRA HEAD WITHOUT CONTRAST
TECHNIQUE: Multiplanar, multi-echo pulse sequences of the brain and surrounding
structures were acquired without intravenous contrast. Angiographic
images of the Circle of Willis were acquired using MRA technique
without intravenous contrast.

[Series 5: DWI · axial · 3.0mm · 0.88mm/px · z∈[-47,+93]mm · 8 of 96 slices shown (1 of 4)]
[im 1/96]
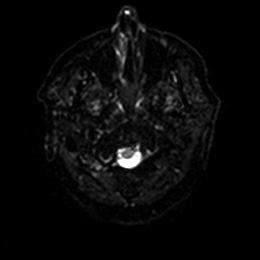
[im 14/96]
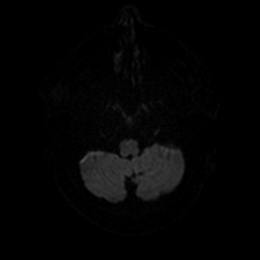
[im 28/96]
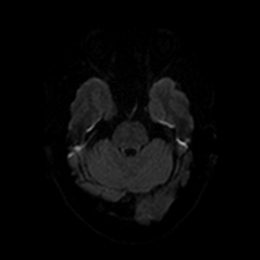
[im 41/96]
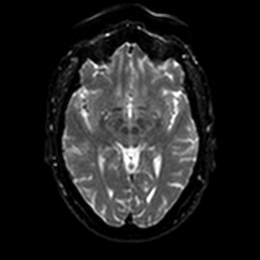
[im 55/96]
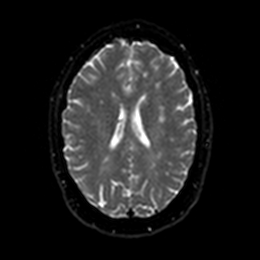
[im 68/96]
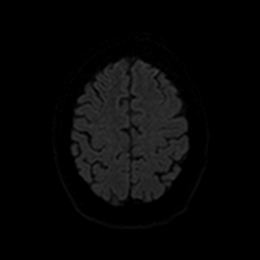
[im 82/96]
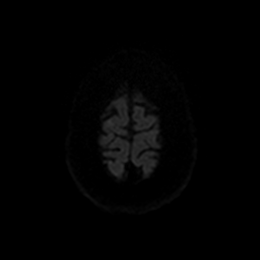
[im 96/96]
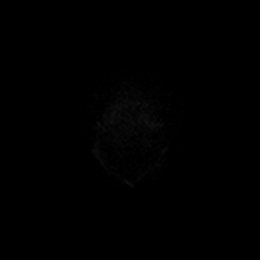

[Series 6: DWI · axial · 3.0mm · 0.88mm/px · z∈[-47,+93]mm · 4 of 48 slices shown (2 of 4)]
[im 1/48]
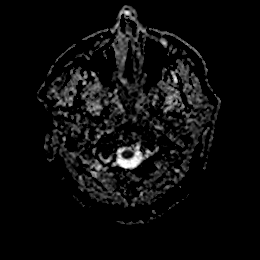
[im 16/48]
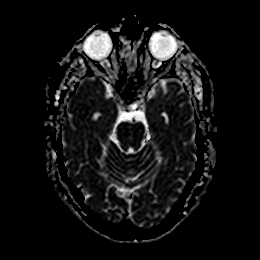
[im 32/48]
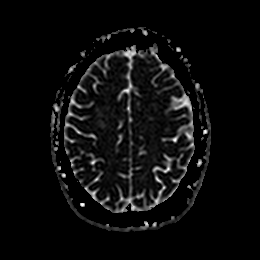
[im 48/48]
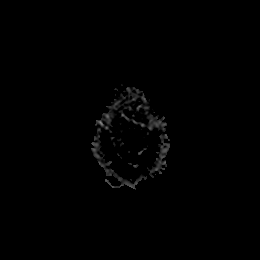

[Series 7: DWI · coronal · 4.0mm · 0.88mm/px · 6 of 64 slices shown (3 of 4)]
[im 1/64]
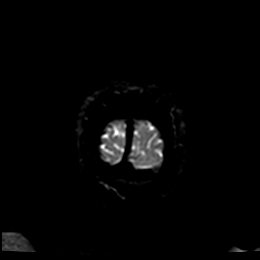
[im 13/64]
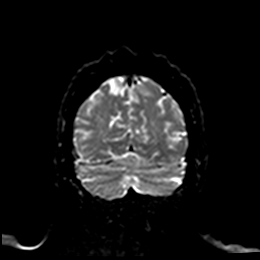
[im 26/64]
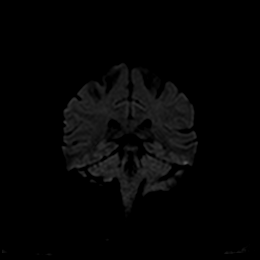
[im 38/64]
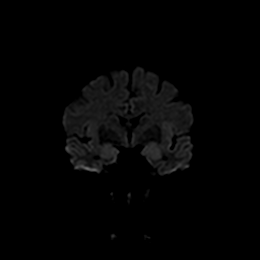
[im 51/64]
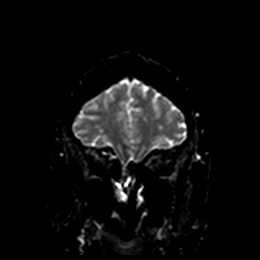
[im 64/64]
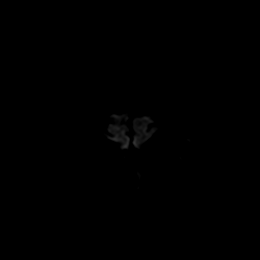

[Series 8: DWI · coronal · 4.0mm · 0.88mm/px · 3 of 32 slices shown (4 of 4)]
[im 1/32]
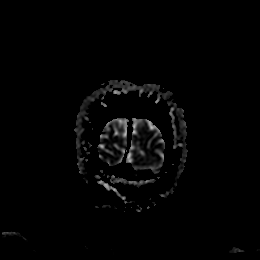
[im 16/32]
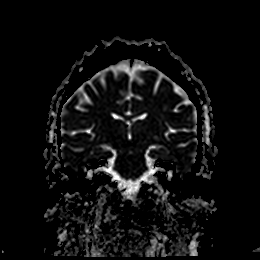
[im 32/32]
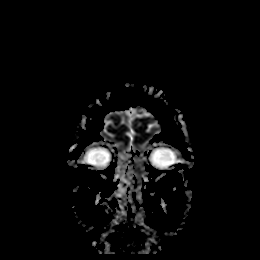

[Series 9: T1 · sagittal · 5.0mm · 0.75mm/px · 2 of 23 slices shown]
[im 1/23]
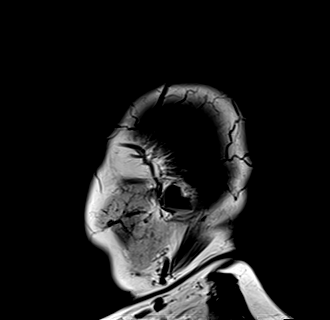
[im 23/23]
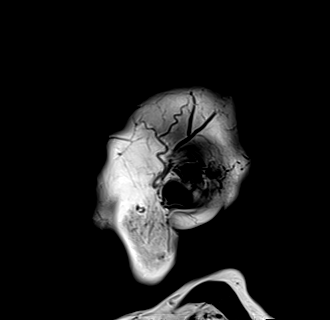

[Series 10: T2 · axial · 5.0mm · 0.72mm/px · z∈[-49,+95]mm · 2 of 25 slices shown (1 of 2)]
[im 1/25]
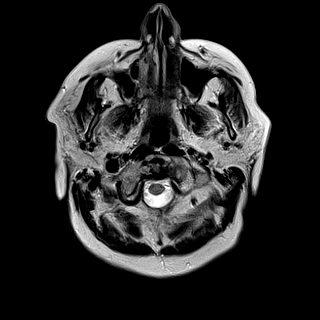
[im 25/25]
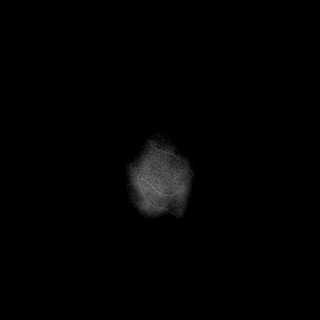

[Series 11: FLAIR · axial · 5.0mm · 0.45mm/px · z∈[-48,+96]mm · 2 of 25 slices shown]
[im 1/25]
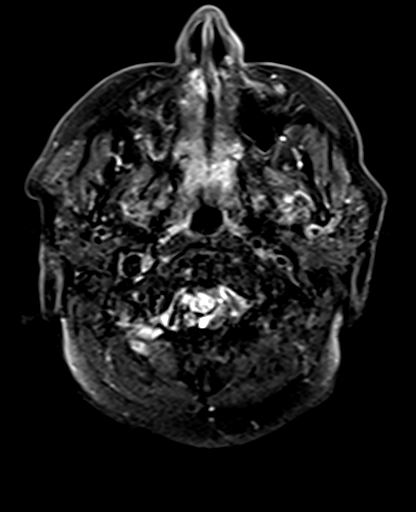
[im 25/25]
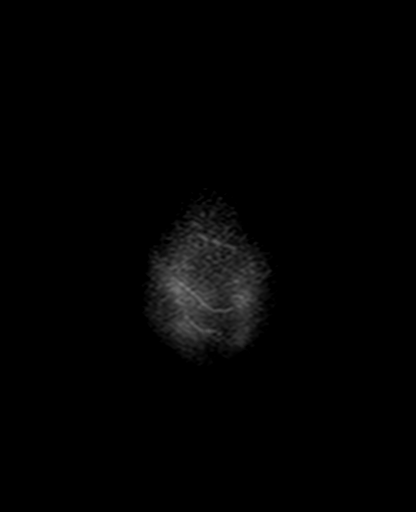

[Series 13: pha_images · axial · 3.0mm · 0.90mm/px · z∈[-64,+113]mm · 6 of 60 slices shown]
[im 1/60]
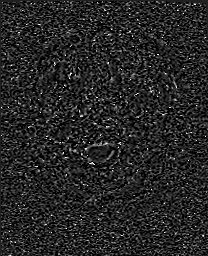
[im 12/60]
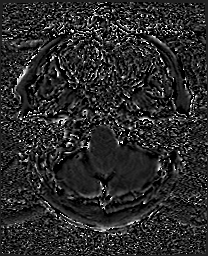
[im 24/60]
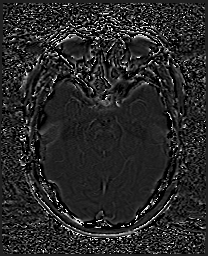
[im 36/60]
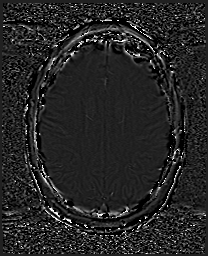
[im 48/60]
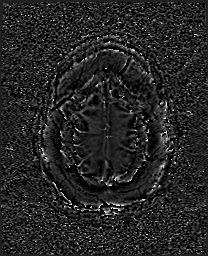
[im 60/60]
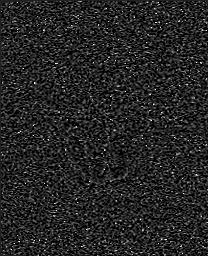

[Series 14: swi_images · axial · 3.0mm · 0.90mm/px · z∈[-64,+113]mm · 6 of 60 slices shown]
[im 1/60]
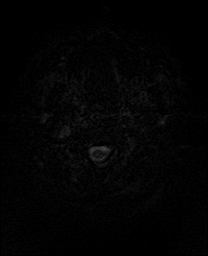
[im 12/60]
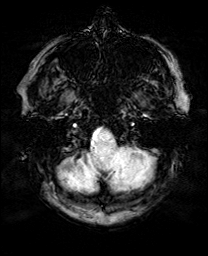
[im 24/60]
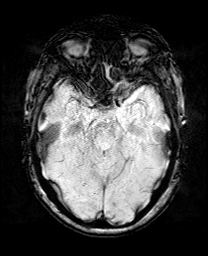
[im 36/60]
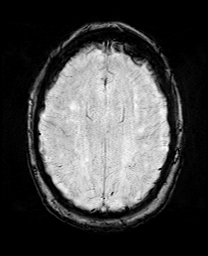
[im 48/60]
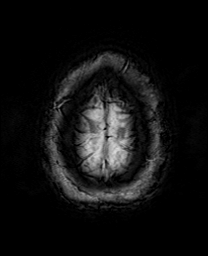
[im 60/60]
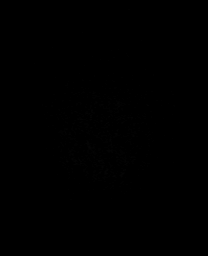

[Series 17: T2 · coronal · 5.0mm · 0.34mm/px · 3 of 29 slices shown (2 of 2)]
[im 1/29]
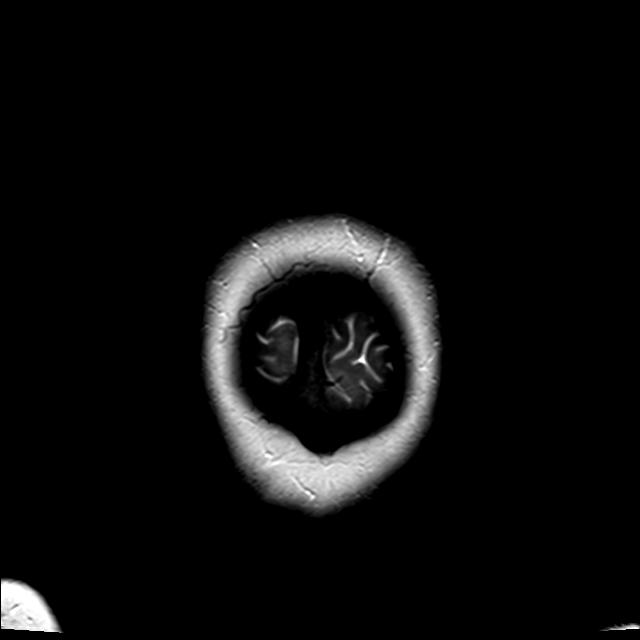
[im 15/29]
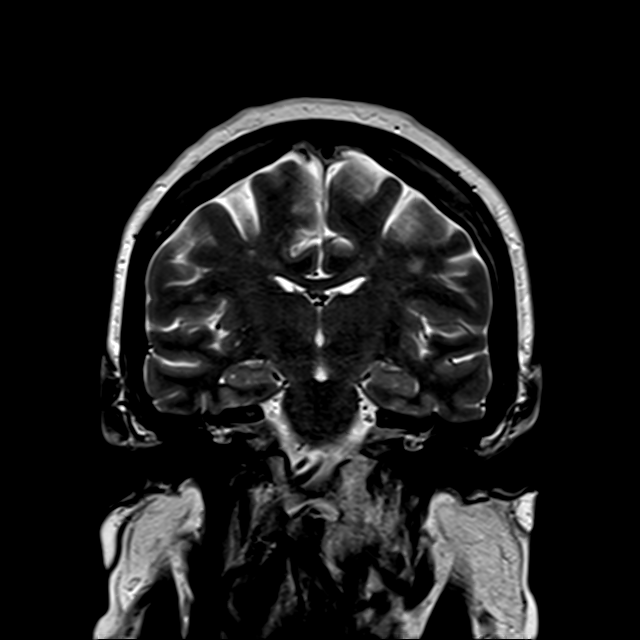
[im 29/29]
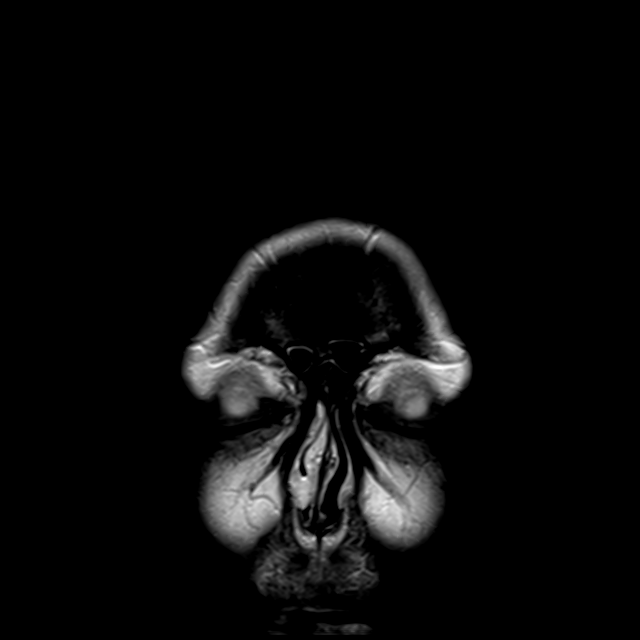

[42 of 48 positions shown; findings below may reference images not displayed]

FINDINGS: MRI HEAD FINDINGS

Brain: No acute infarct, mass effect or extra-axial collection. No
acute or chronic hemorrhage. There is multifocal hyperintense
T2-weighted signal within the white matter. Parenchymal volume and
CSF spaces are normal. The midline structures are normal.

Vascular: Major flow voids are preserved.

Skull and upper cervical spine: Normal calvarium and skull base.
Visualized upper cervical spine and soft tissues are normal.

Sinuses/Orbits:No paranasal sinus fluid levels or advanced mucosal
thickening. No mastoid or middle ear effusion. Normal orbits.

MRA HEAD FINDINGS

POSTERIOR CIRCULATION:

--Vertebral arteries: Normal

--Inferior cerebellar arteries: Normal.

--Basilar artery: Normal.

--Superior cerebellar arteries: Normal.

--Posterior cerebral arteries: Normal.

ANTERIOR CIRCULATION:

--Intracranial internal carotid arteries: Normal.

--Anterior cerebral arteries (ACA): Normal.

--Middle cerebral arteries (MCA): Normal.

ANATOMIC VARIANTS: None
IMPRESSION: 1. No acute intracranial abnormality.
2. Normal intracranial MRA.
3. Mild chronic small vessel disease.

## 2022-02-23 IMAGING — MR MR MRA HEAD W/O CM
1 series · 19 of 48 positions shown · non-contrast
Comparison: No pertinent prior exam.

CLINICAL DATA: Transient ischemic attack

EXAM:
MRI HEAD WITHOUT CONTRAST
MRA HEAD WITHOUT CONTRAST
TECHNIQUE: Multiplanar, multi-echo pulse sequences of the brain and surrounding
structures were acquired without intravenous contrast. Angiographic
images of the Circle of Willis were acquired using MRA technique
without intravenous contrast.

[Series 5: 3d cow · axial · 0.5mm · 0.41mm/px · z∈[-69,+7]mm · 19 of 160 slices shown]
[im 1/160]
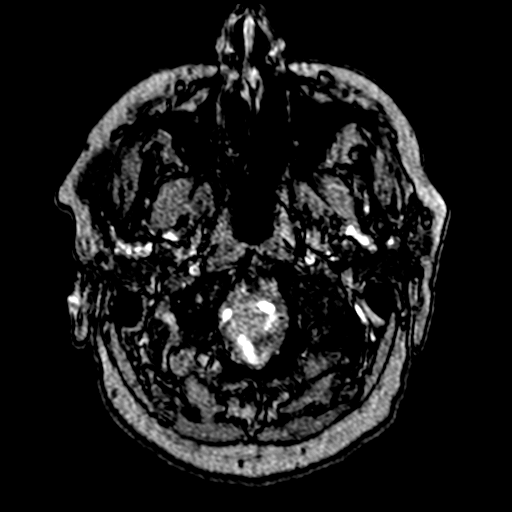
[im 4/160]
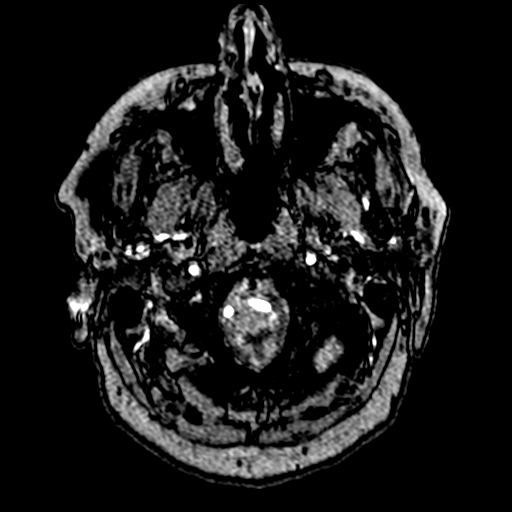
[im 7/160]
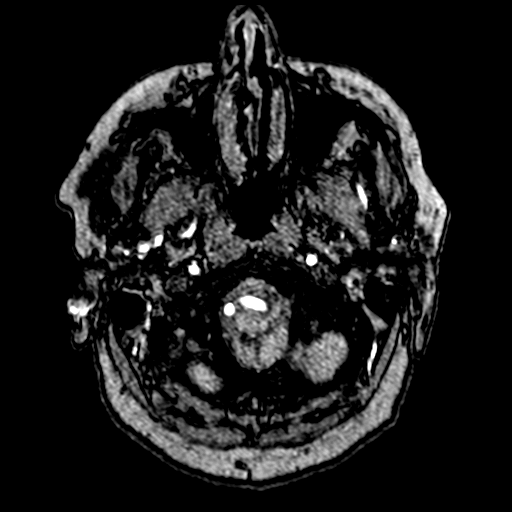
[im 11/160]
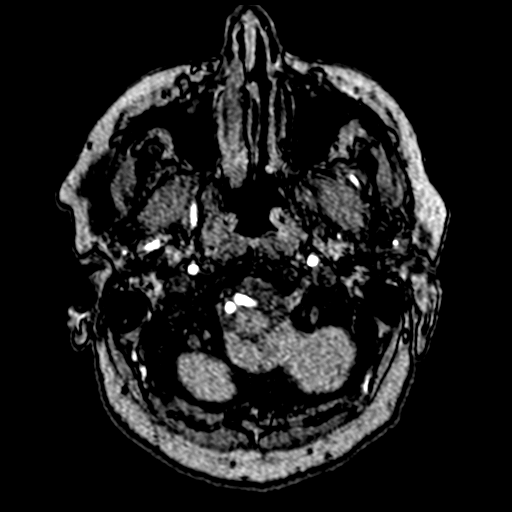
[im 14/160]
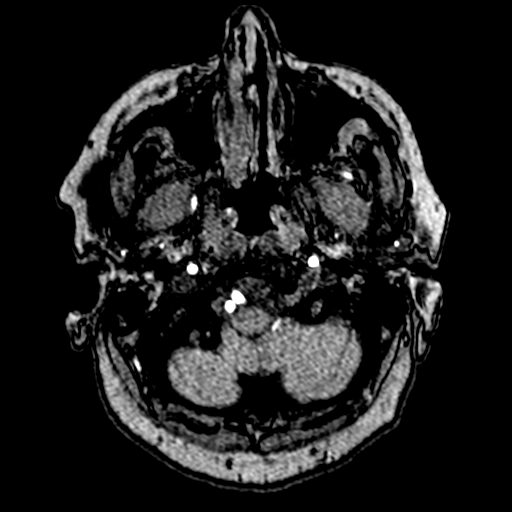
[im 17/160]
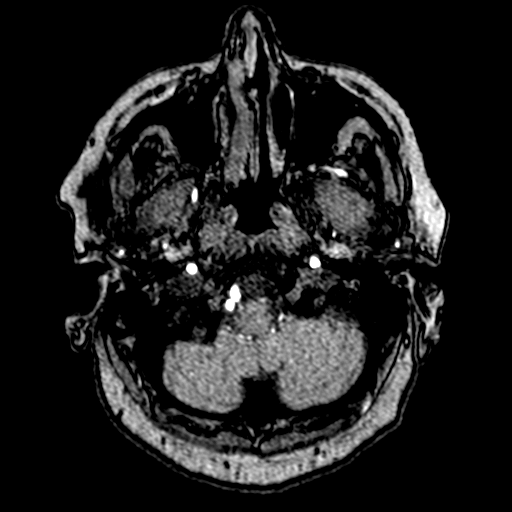
[im 21/160]
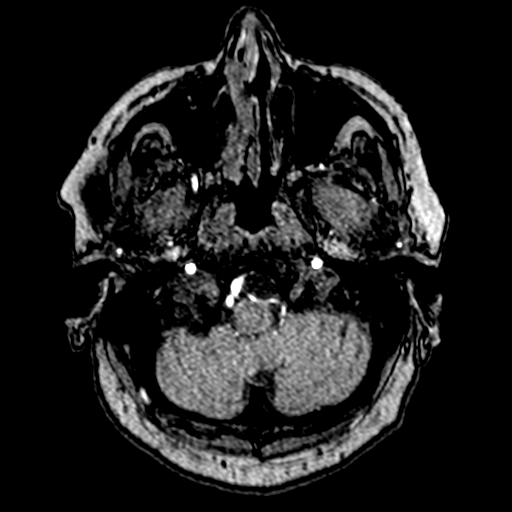
[im 24/160]
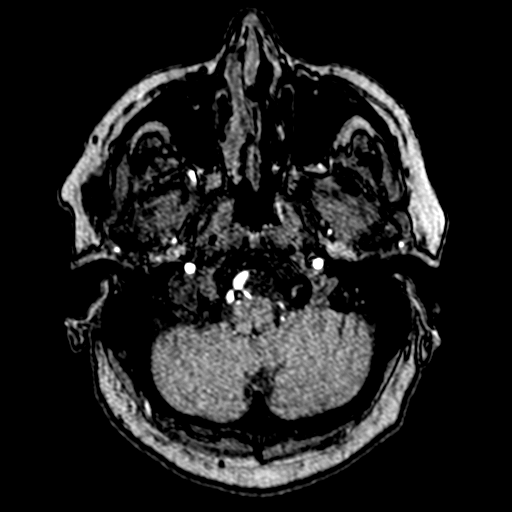
[im 28/160]
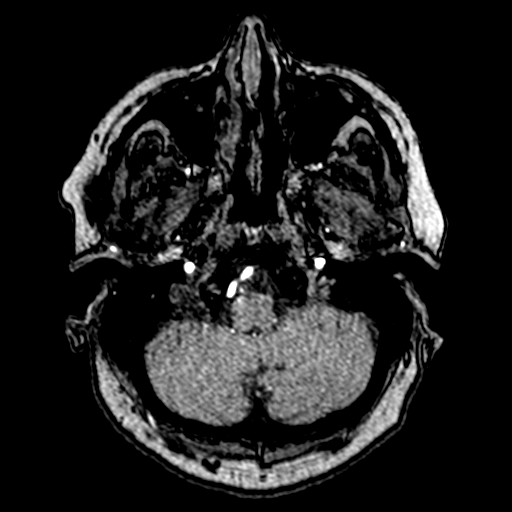
[im 31/160]
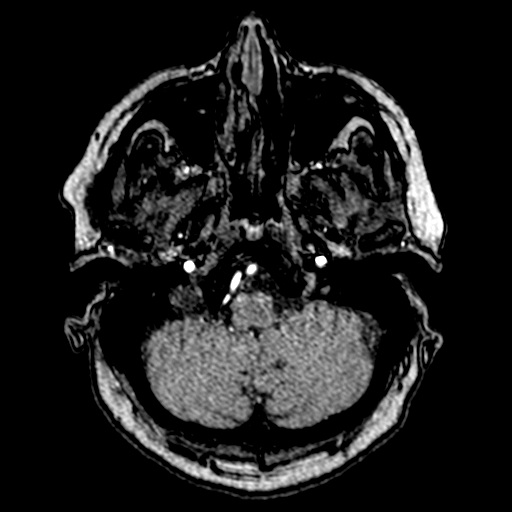
[im 34/160]
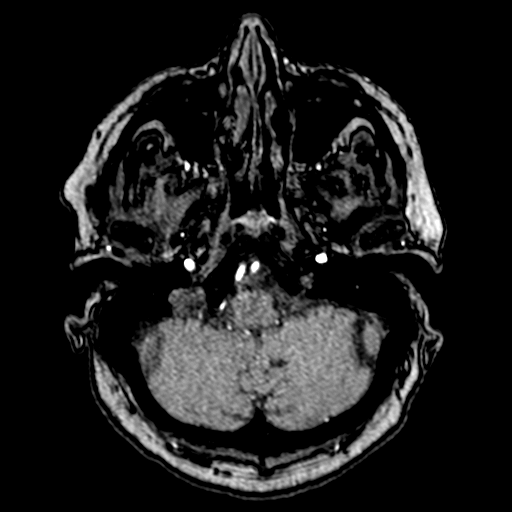
[im 51/160]
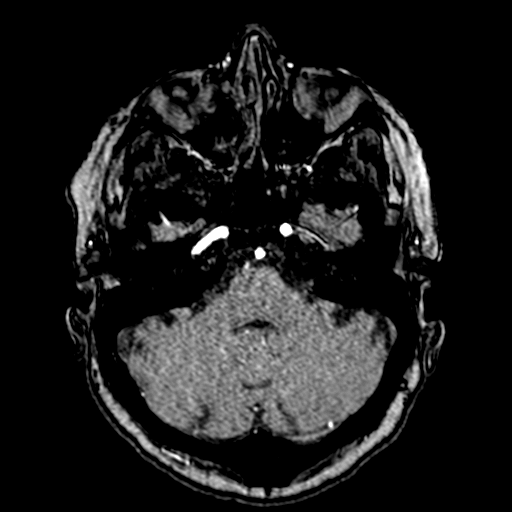
[im 72/160]
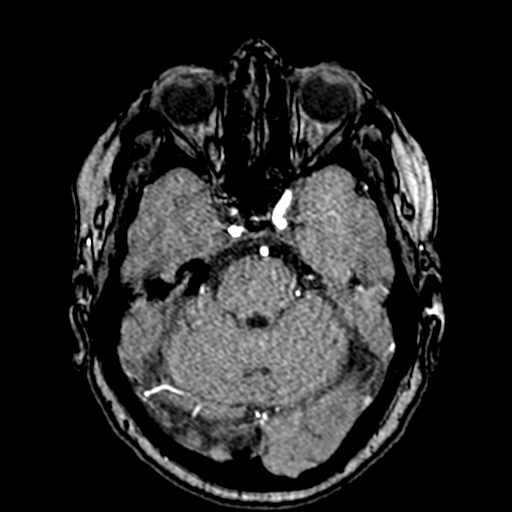
[im 82/160]
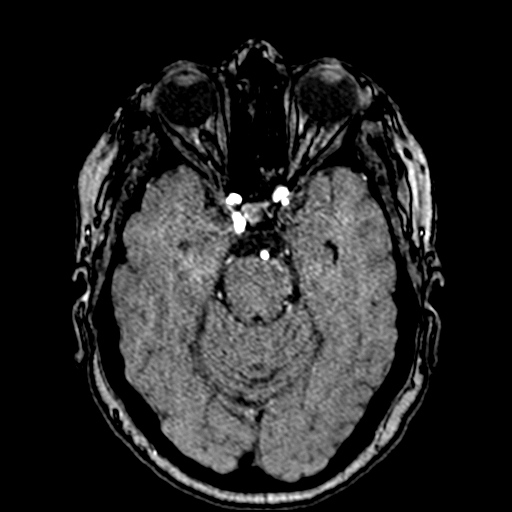
[im 92/160]
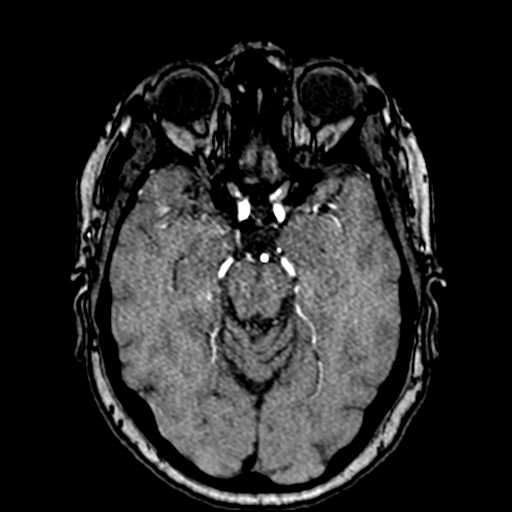
[im 112/160]
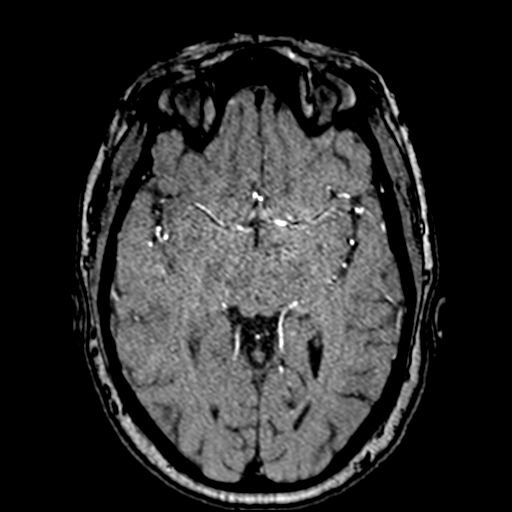
[im 132/160]
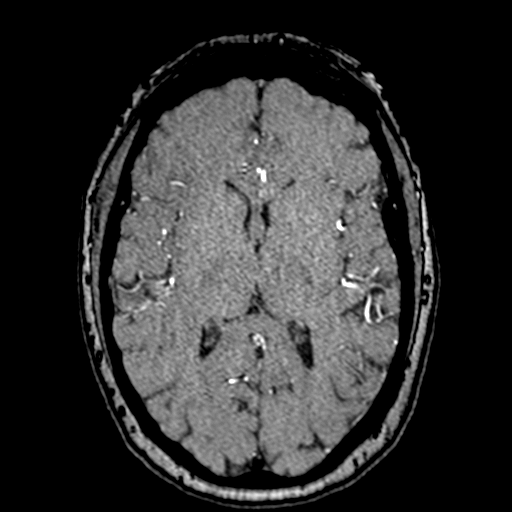
[im 136/160]
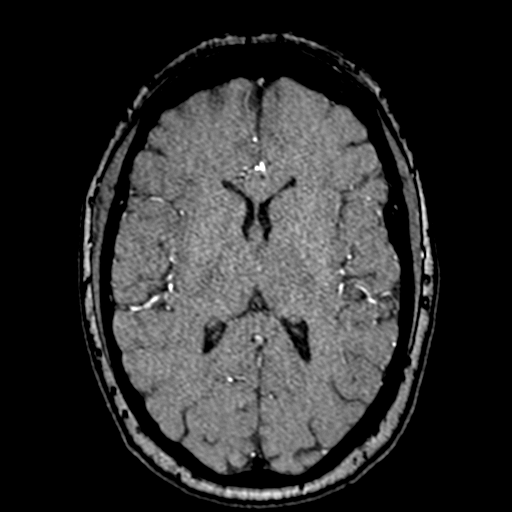
[im 153/160]
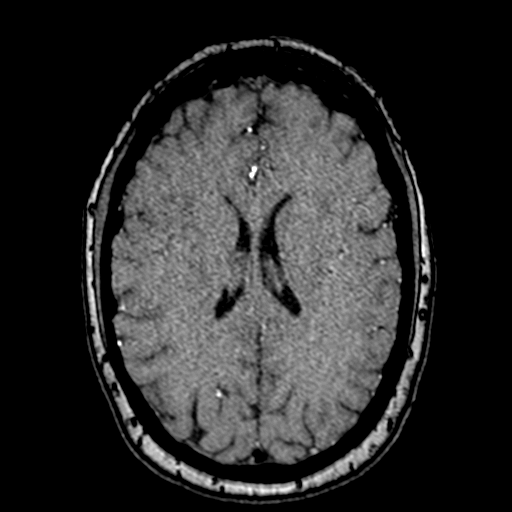

[19 of 48 positions shown; findings below may reference images not displayed]

FINDINGS: MRI HEAD FINDINGS

Brain: No acute infarct, mass effect or extra-axial collection. No
acute or chronic hemorrhage. There is multifocal hyperintense
T2-weighted signal within the white matter. Parenchymal volume and
CSF spaces are normal. The midline structures are normal.

Vascular: Major flow voids are preserved.

Skull and upper cervical spine: Normal calvarium and skull base.
Visualized upper cervical spine and soft tissues are normal.

Sinuses/Orbits:No paranasal sinus fluid levels or advanced mucosal
thickening. No mastoid or middle ear effusion. Normal orbits.

MRA HEAD FINDINGS

POSTERIOR CIRCULATION:

--Vertebral arteries: Normal

--Inferior cerebellar arteries: Normal.

--Basilar artery: Normal.

--Superior cerebellar arteries: Normal.

--Posterior cerebral arteries: Normal.

ANTERIOR CIRCULATION:

--Intracranial internal carotid arteries: Normal.

--Anterior cerebral arteries (ACA): Normal.

--Middle cerebral arteries (MCA): Normal.

ANATOMIC VARIANTS: None
IMPRESSION: 1. No acute intracranial abnormality.
2. Normal intracranial MRA.
3. Mild chronic small vessel disease.

## 2022-02-23 MED ORDER — ATORVASTATIN CALCIUM 80 MG PO TABS
80.0000 mg | ORAL_TABLET | Freq: Every day | ORAL | 2 refills | Status: AC
Start: 2022-02-24 — End: 2022-05-25

## 2022-02-23 MED ORDER — CLOPIDOGREL BISULFATE 75 MG PO TABS: 75.0000 mg | ORAL_TABLET | Freq: Every day | ORAL | 0 refills | Status: AC

## 2022-02-23 MED ORDER — ASPIRIN 81 MG PO TBEC: 81.0000 mg | DELAYED_RELEASE_TABLET | Freq: Every day | ORAL | 2 refills | Status: AC

## 2022-02-23 MED ORDER — ATORVASTATIN CALCIUM 40 MG PO TABS
80.0000 mg | ORAL_TABLET | Freq: Every day | ORAL | Status: DC
  Administered 2022-02-23: 80 mg via ORAL
  Filled 2022-02-23: qty 2

## 2022-02-23 NOTE — Evaluation (Signed)
Physical Therapy Evaluation ?Patient Details ?Name: Felicia Weeks ?MRN: [id number] ?DOB: 11/26/1961 ?Today's Date: 02/23/2022 ? ?History of Present Illness ? Pt is a 61 y/o female presenting on 3/19 with slurred speech, R UE/LE numbness. CT negative, MRI negative. PMH includes: COPD, DDD, HTN, HLD.  ?Clinical Impression ? Patient presented to the ED on 02/22/22 with slurred speech, and R facial, UE/LE numbness consistent with patient's diagnosis of a TIA. Pt's impairments include decreased coordination, strength, power, and balance with mobility tasks. These impairments are limiting her ability to safely and independently transfer, ambulate, and navigate stairs. Patient requires min guard A with all functional mobility without an AD. BP high during session. Vitals below and RN notified. She was educated on using a rolling walker initially when returning home for safety purposes. SPT recommending Home Health PT upon D/C due to mobility deficits. PT will continue to follow acutely to maximize pt's independence and safety with functional mobility. ?   ?Supine BP: 170/80 ?Sitting BP: 200/71 (forearm while pt moving)  ? ?Recommendations for follow up therapy are one component of a multi-disciplinary discharge planning process, led by the attending physician.  Recommendations may be updated based on patient status, additional functional criteria and insurance authorization. ? ?Follow Up Recommendations Home health PT ? ?  ?Assistance Recommended at Discharge Frequent or constant Supervision/Assistance  ?Patient can return home with the following ? A little help with bathing/dressing/bathroom;Assistance with cooking/housework;Assist for transportation;Help with stairs or ramp for entrance ? ?  ?Equipment Recommendations Rolling walker (2 wheels)  ?Recommendations for Other Services ?    ?  ?Functional Status Assessment Patient has had a recent decline in their functional status and demonstrates the ability to make  significant improvements in function in a reasonable and predictable amount of time.  ? ?  ?Precautions / Restrictions Precautions ?Precautions: Fall ?Restrictions ?Weight Bearing Restrictions: No  ? ?  ? ?Mobility ? Bed Mobility ?Overal bed mobility: Needs Assistance ?Bed Mobility: Sit to Supine, Supine to Sit ?  ?  ?Supine to sit: Min guard, HOB elevated ?Sit to supine: Min guard ?  ?General bed mobility comments: BP in supine 170/80. Min guard required for safety upon sitting with heavy reliance on bed rails on stretcher. Pt required min guard from sit to supine for safety. Forearm BP in sitting 200/71 with pt moving her arm during reading. Pt had no dizziness or symptoms of increased BP. ?  ? ?Transfers ?Overall transfer level: Needs assistance ?Equipment used: None ?Transfers: Sit to/from Stand ?Sit to Stand: Min guard ?  ?  ?  ?  ?  ?General transfer comment: pt required min guard assist to stand for safety and steadying. ?  ? ?Ambulation/Gait ?Ambulation/Gait assistance: Min guard ?Gait Distance (Feet): 40 Feet ?Assistive device: None ?Gait Pattern/deviations: Step-through pattern, Decreased step length - right, Decreased stride length, Decreased dorsiflexion - right, Antalgic ?Gait velocity: decreased ?Gait velocity interpretation: <1.31 ft/sec, indicative of household ambulator ?  ?General Gait Details: pt required min guard with ambulation with mild BLE shakiness noted. Pt was slow and very cautious with gait. Pt had no apparent LOB. Pt had decreased push off with RLE and dragged the R leg/pulled it through with increased effort during swing phase. ? ?Stairs ?  ?  ?  ?  ?  ? ?Wheelchair Mobility ?  ? ?Modified Rankin (Stroke Patients Only) ?Modified Rankin (Stroke Patients Only) ?Pre-Morbid Rankin Score: No symptoms ?Modified Rankin: No significant disability ? ?  ? ?Balance Overall balance assessment: Needs  assistance, Mild deficits observed, not formally tested ?Sitting-balance support: Feet unsupported,  Single extremity supported, Bilateral upper extremity supported ?Sitting balance-Leahy Scale: Fair ?Sitting balance - Comments: pt able to maintain seated balance with SUE, however preferred BUE. Pt had difficulty keeping trunk erect during LE strength testing and AROM with a mild posterior lean; pt able to correct when statically sitting. ?  ?Standing balance support: No upper extremity supported, During functional activity ?Standing balance-Leahy Scale: Fair ?Standing balance comment: pt reuqiured min guard A for safety and steadying during ambulation. ?  ?  ?  ?  ?  ?  ?  ?  ?  ?  ?  ?   ? ? ? ?Pertinent Vitals/Pain Pain Assessment ?Pain Assessment: Faces ?Faces Pain Scale: No hurt  ? ? ?Home Living Family/patient expects to be discharged to:: Private residence ?Living Arrangements: Children (daughter and her fiancee - grandkids) ?Available Help at Discharge: Family;Available PRN/intermittently ?Type of Home: House ?Home Access: Stairs to enter ?Entrance Stairs-Rails: None ?Entrance Stairs-Number of Steps: 3 ?  ?Home Layout: One level ?Home Equipment: None ?Additional Comments: pt uses sink counter on the L to help pull herself up from lower toilet seat.  ?  ?Prior Function Prior Level of Function : Independent/Modified Independent;Driving ?  ?  ?  ?  ?  ?  ?Mobility Comments: pt not using an AD for mobility, however, has borrowed her sister's RW when she is having back flare ups. ?ADLs Comments: ind ADLs/IADLs ?  ? ? ?Hand Dominance  ? Dominant Hand: Right ? ?  ?Extremity/Trunk Assessment  ? Upper Extremity Assessment ?Upper Extremity Assessment: Defer to OT evaluation ?  ? ?Lower Extremity Assessment ?Lower Extremity Assessment: RLE deficits/detail;Generalized weakness ?RLE Deficits / Details: pt had 4/5 MMTs on RLE; pt had mild coordination deficits with heel to shin test and decreased speed with toe tapping activity. ?RLE Sensation: WNL ?RLE Coordination: decreased gross motor ?  ? ?Cervical / Trunk  Assessment ?Cervical / Trunk Assessment: Normal  ?Communication  ? Communication: No difficulties (during assessment, pt daughter called and said pt still is not speaking clearly at her baseline. SPT noted mild mumbling, however, pt appeared to be speaking clearly.)  ?Cognition Arousal/Alertness: Awake/alert ?Behavior During Therapy: Flat affect ?Overall Cognitive Status: No family/caregiver present to determine baseline cognitive functioning ?  ?  ?  ?  ?  ?  ?  ?  ?  ?  ?  ?  ?  ?  ?  ?  ?  ?  ?  ? ?  ?General Comments General comments (skin integrity, edema, etc.): discussed using a walker initially post D/C for safety ? ?  ?Exercises    ? ?Assessment/Plan  ?  ?PT Assessment Patient needs continued PT services  ?PT Problem List Decreased strength;Decreased range of motion;Decreased activity tolerance;Decreased balance;Decreased mobility;Decreased coordination;Decreased knowledge of use of DME ? ?   ?  ?PT Treatment Interventions DME instruction;Gait training;Stair training;Functional mobility training;Therapeutic activities;Therapeutic exercise;Balance training;Neuromuscular re-education;Patient/family education   ? ?PT Goals (Current goals can be found in the Care Plan section)  ?Acute Rehab PT Goals ?Patient Stated Goal: to go home ?PT Goal Formulation: With patient ?Time For Goal Achievement: 03/09/22 ?Potential to Achieve Goals: Good ? ?  ?Frequency Min 3X/week ?  ? ? ?Co-evaluation   ?  ?  ?  ?  ? ? ?  ?AM-PAC PT "6 Clicks" Mobility  ?Outcome Measure Help needed turning from your back to your side while in a flat bed  without using bedrails?: None ?Help needed moving from lying on your back to sitting on the side of a flat bed without using bedrails?: A Little ?Help needed moving to and from a bed to a chair (including a wheelchair)?: A Little ?Help needed standing up from a chair using your arms (e.g., wheelchair or bedside chair)?: A Little ?Help needed to walk in hospital room?: A Little ?Help needed  climbing 3-5 steps with a railing? : A Lot ?6 Click Score: 18 ? ?  ?End of Session Equipment Utilized During Treatment: Gait belt ?Activity Tolerance: Patient tolerated treatment well ?Patient left: in bed;with call bell/ph

## 2022-02-23 NOTE — Hospital Course (Signed)
Felicia Weeks is a 61 year old female with past medical history significant for COPD, essential hypertension, hyperlipidemia, obesity, tobacco abuse disorder who presented to [facility] ED on 3/19 with slurred speech, right arm/leg weakness/numbness starting roughly at 6:30 PM.  Patient reports that she was sitting in her living room, on her iPhone when she started to have tingling paresthesias of her right hand.  She initially thought this was due to her sitting down and looking at her phone for prolonged period of time and then she noticed she had some tingling in her right leg.  Patient notified her daughter, when she got up out of the couch she noticed that she was having some slurred speech and daughter noted a slight droop on patient's right side of face. ? ?In the ED, temperature 97.9 ?F, HR 99, RR 18, BP 234/87, SPO2 99% on room air.  Sodium 139, potassium 3.6, chloride 105, CO2 23, glucose 111, BUN 12, creatinine 1.36.  AST 18, ALT 15, total bilirubin 0.8.  WBC 5.7, hemoglobin 14.9, platelets 109.  INR 1.1.  COVID-19 PCR negative.  Influenza A/B PCR negative.  Urinalysis unrevealing.  EtOH level less than 10.  UDS negative.  CT head without contrast with no acute intracranial abnormality.  Neurology was consulted and recommended hospitalist admission for further evaluation and work-up of acute CVA versus TIA. ?

## 2022-02-23 NOTE — Care Management Obs Status (Signed)
MEDICARE OBSERVATION STATUS NOTIFICATION ? ? ?Patient Details  ?Name: Felicia Weeks ?MRN: [id number] ?Date of Birth: 1961-11-18 ? ? ?Medicare Observation Status Notification Given:  Yes ? ? ? ?Fuller Mandril, RN ?02/23/2022, 3:15 PM ?

## 2022-02-23 NOTE — ED Notes (Signed)
Patient transported to MRI 

## 2022-02-23 NOTE — Discharge Summary (Signed)
?Physician Discharge Summary  ?Felicia LiterJeanie D Wymer ZOX:[id number] DOB: 02/18/1961 DOA: 02/22/2022 ? ?PCP: April MansonWhite, Marsha L, NP ? ?Admit date: 02/22/2022 ?Discharge date: 02/23/2022 ? ?Admitted From: Home ?Disposition: Home ? ?Recommendations for Outpatient Follow-up:  ?Follow up with PCP in 1-2 weeks ?Follow-up with neurology 4 weeks for suspected TIA ?Continue aspirin and Plavix x3 weeks followed by aspirin alone ?Atorvastatin increased to 80 mg p.o. daily ?Continue to encourage tobacco cessation ? ?Home Health: PT/home health aide ?Equipment/Devices: Rolling walker ? ?Discharge Condition: Stable ?CODE STATUS: Full code ?Diet recommendation: Heart healthy/consistent carb regular diet ? ?History of present illness: ? ?Felicia Weeks is a 61 year old female with past medical history significant for COPD, essential hypertension, hyperlipidemia, obesity, tobacco abuse disorder who [facility] on [facility]/19 with slurred speech, right arm/leg weakness/numbness starting roughly at 6:30 PM.  Patient reports that she was sitting in her living room, on her iPhone when she started to have tingling paresthesias of her right hand.  She initially thought this was due to her sitting down and looking at her phone for prolonged period of time and then she noticed she had some tingling in her right leg.  Patient notified her daughter, when she got up out of the couch she noticed that she was having some slurred speech and daughter noted a slight droop on patient's right side of face. ? ?In the ED, temperature 97.9 ?F, HR 99, RR 18, BP 234/87, SPO2 99% on room air.  Sodium 139, potassium 3.6, chloride 105, CO2 23, glucose 111, BUN 12, creatinine 1.36.  AST 18, ALT 15, total bilirubin 0.8.  WBC 5.7, hemoglobin 14.9, platelets 109.  INR 1.1.  COVID-19 PCR negative.  Influenza A/B PCR negative.  Urinalysis unrevealing.  EtOH level less than 10.  UDS negative.  CT head without contrast with no acute intracranial abnormality.  Neurology was  consulted and recommended hospitalist admission for further evaluation and work-up of acute CVA versus TIA. ? ?Hospital course: ? ?Assessment and Plan: ?* TIA (transient ischemic attack) ?Patient presenting to the ED with acute onset right upper/lower extremity paresthesias, weakness with associated reported right-sided facial droop.  Risk factors include HTN, HLD, tobacco abuse.  CT head without contrast with no acute intracranial findings.  MR brain without contrast with no acute intracranial abnormality.  MRA head normal intracranial MRA.  Carotid Dopplers with bilateral ICA stenosis 1-39%, bilateral vertebral artery antegrade flow.  TTE with LVEF 60 to 65%, no interatrial shunt.  LDL 118, hemoglobin A1c 5.3.  Neurology was consulted and followed during hospital course.  Atorvastatin was increased to 80 mg p.o. daily.  We will continue dual antiplatelet therapy for suspected TIA with aspirin and Plavix followed by aspirin alone.  Seen by PT with recommendations of home health and rolling walker.  OT/SLP with no needs identified.  Will need outpatient follow-up with neurology 4 weeks.  Continue to encourage tobacco cessation. ? ?Essential hypertension ?Home regimen includes lisinopril/HCTZ, metoprolol tartrate 25 mg p.o. twice daily.. ? ?Hyperlipidemia ?Lipid panel with total cholesterol 188, LDL 118, HDL 46, triglycerides 119. Home atorvastatin increased to 80 mg p.o. daily ? ?COPD (chronic obstructive pulmonary disease) (HCC) ?Oxygenating well on room air.  Discussed need for tobacco cessation. ? ?Tobacco abuse ?Counseled on need for complete cessation. Declined nicotine patch while inpatient. ? ?Morbid obesity with BMI of 50.0-59.9, adult (HCC) ?Discussed with patient needs for aggressive lifestyle changes/weight loss as this complicates all facets of care.  Outpatient follow-up with PCP.   ? ? ? ? ? ? ?  Discharge Diagnoses:  ?Principal Problem: ?  TIA (transient ischemic attack) ?Active Problems: ?  Essential  hypertension ?  Hyperlipidemia ?  COPD (chronic obstructive pulmonary disease) (HCC) ?  Tobacco abuse ?  Morbid obesity with BMI of 50.0-59.9, adult (HCC) ? ? ? ?Discharge Instructions ? ?Discharge Instructions   ? ? Ambulatory referral to Neurology   Complete by: As directed ?  ? An appointment is requested in approximately: 4 weeks  ? Call MD for:  difficulty breathing, headache or visual disturbances   Complete by: As directed ?  ? Call MD for:  extreme fatigue   Complete by: As directed ?  ? Call MD for:  persistant dizziness or light-headedness   Complete by: As directed ?  ? Call MD for:  persistant nausea and vomiting   Complete by: As directed ?  ? Call MD for:  severe uncontrolled pain   Complete by: As directed ?  ? Call MD for:  temperature >100.4   Complete by: As directed ?  ? Diet - low sodium heart healthy   Complete by: As directed ?  ? Increase activity slowly   Complete by: As directed ?  ? ?  ? ?Allergies as of 02/23/2022   ? ?   Reactions  ? Benadryl [diphenhydramine] Swelling  ? Vicodin [hydrocodone-acetaminophen] Swelling  ? ?  ? ?  ?Medication List  ?  ? ?TAKE these medications   ? ?acetaminophen 500 MG tablet ?Commonly known as: TYLENOL ?Take 500 mg by mouth every 6 (six) hours as needed for moderate pain or headache. ?  ?aspirin 81 MG EC tablet ?Take 1 tablet (81 mg total) by mouth daily. Swallow whole. ?Start taking on: February 24, 2022 ?  ?atorvastatin 80 MG tablet ?Commonly known as: LIPITOR ?Take 1 tablet (80 mg total) by mouth daily. ?Start taking on: February 24, 2022 ?What changed:  ?medication strength ?how much to take ?  ?clopidogrel 75 MG tablet ?Commonly known as: PLAVIX ?Take 1 tablet (75 mg total) by mouth daily for 21 days. ?Start taking on: February 24, 2022 ?  ?ibuprofen 200 MG tablet ?Commonly known as: ADVIL ?Take 200 mg by mouth every 6 (six) hours as needed for headache or moderate pain. ?  ?lisinopril-hydrochlorothiazide 20-12.5 MG tablet ?Commonly known as: Zestoretic ?Take 1  tablet by mouth daily. ?  ?metoprolol tartrate 25 MG tablet ?Commonly known as: LOPRESSOR ?Take 1 tablet (25 mg total) by mouth 2 (two) times daily. ?  ? ?  ? ?  ?  ? ? ?  ?Durable Medical Equipment  ?(From admission, onward)  ?  ? ? ?  ? ?  Start     Ordered  ? 02/23/22 1231  For home use only DME Walker rolling  Once       ?Question Answer Comment  ?Walker: With 5 Inch Wheels   ?Patient needs a walker to treat with the following condition Gait abnormality   ?  ? 02/23/22 1230  ? ?  ?  ? ?  ? ? Follow-up Information   ? ? April Manson, NP. Schedule an appointment as soon as possible for a visit in 1 week(s).   ?Specialty: Family Medicine ?Contact information: ?[address] ?[place] [address] ?[phone] ? ? ?  ?  ? ? [facility]. Schedule an appointment as soon as possible for a visit in 4 week(s).   ?Specialty: Neurology ?Contact information: ?[address] ?[facility] [place] [address] ?  [phone] ? ?  ?  ? ?  ?  ? ?  ? ?Allergies  ?Allergen Reactions  ? Benadryl [Diphenhydramine] Swelling  ? Vicodin [Hydrocodone-Acetaminophen] Swelling  ? ? ?Consultations: ?Neurology ? ? ?Procedures/Studies: ?MR ANGIO HEAD WO CONTRAST ? ?Result Date: 02/23/2022 ?CLINICAL DATA:  Transient ischemic attack EXAM: MRI HEAD WITHOUT CONTRAST MRA HEAD WITHOUT CONTRAST TECHNIQUE: Multiplanar, multi-echo pulse sequences of the brain and surrounding structures were acquired without intravenous contrast. Angiographic images of the Circle of Willis were acquired using MRA technique without intravenous contrast. COMPARISON:  No pertinent prior exam. FINDINGS: MRI HEAD FINDINGS Brain: No acute infarct, mass effect or extra-axial collection. No acute or chronic hemorrhage. There is multifocal hyperintense T2-weighted signal within the white matter. Parenchymal volume and CSF spaces are normal. The midline structures are normal. Vascular: Major flow voids are preserved. Skull and upper  cervical spine: Normal calvarium and skull base. Visualized upper cervical spine and soft tissues are normal. Sinuses/Orbits:No paranasal sinus fluid levels or advanced mucosal thickening. No mastoid or middle ea

## 2022-02-23 NOTE — Progress Notes (Addendum)
STROKE TEAM PROGRESS NOTE  ? ?ATTENDING NOTE: ?I reviewed above note and agree with the assessment and plan. Pt was seen and examined.  ? ?61 year old female with history of hypertension, hyperlipidemia, smoker, migraine admitted for right-sided numbness, lip numbness for 10 minutes and resolved.  Patient stated that she has history of migraine with right-sided headache pounding throbbing not very frequent but she had daily chronic bifrontal headache.  Yesterday she had this bifrontal headache and then started having right sided numbness and lip numbness but lasted 10 minutes and resolved.  Today she still has mild bifrontal headache.  CT no acute abnormality.  MRI no acute infarct, MRA head and carotid Doppler unremarkable.  EF 60 to 65%.  UDS negative.  LDL 118, A1c 5.3.  Platelet 109.  Creatinine 1.30.  BP still elevated. ? ?On exam, patient awake alert, orientated x3, no aphasia, follows simple commands.  Neuro intact except left hand and foot 4/5 but effort related. ? ?Etiology for patient symptoms concerning for complicated migraine.  However given her risk factors, TIA cannot be ruled out.  Recommend aspirin 81 and Plavix 75 DAPT for 3 weeks and then aspirin alone.  Increase Lipitor 40-80.  Aggressive risk factor modification.  Smoke cessation education provided.  PT/OT recommend home health PT/OT. ? ?For detailed assessment and plan, please refer to above as I have made changes wherever appropriate.  ? ?Neurology will sign off. Please call with questions. Pt will follow up with stroke clinic NP at [facility] in about 4 weeks. Thanks for the consult. ? ? ?Marvel Plan, MD PhD ?Stroke Neurology ?02/23/2022 ?7:00 PM ? ? ? ?INTERVAL HISTORY ?No family at bedside. Patient was comfortable, awake, alert, engaged, nml speech.  ?Patient reported at rest when she noted right fingers and right toes tingling then right sided lip tingling. She also had a b/l frontal headache and dizziness, blurry vision. These sxs lasted about  5-81mins then resolved.  ?Current tobacco abuse.  ?Reported near daily frontal headaches and sporadic unilateral headaches, about 2/mo. Unilateral headaches are pounding in nature and does not last long, reported pain 5/10. Denied prodromal sxs or auras in the past. Otherwise, patient has no other concerns.  ? ?Vitals:  ? 02/23/22 1100 02/23/22 1130 02/23/22 1430 02/23/22 1500  ?BP: (!) 178/50 (!) 179/52 (!) 165/61 (!) 172/68  ?Pulse: (!) 47 (!) 51 (!) 51 (!) 54  ?Resp: 15 13  16   ?Temp:    98.8 ?F (37.1 ?C)  ?TempSrc:    Oral  ?SpO2: 98% 97% 97% 94%  ? ?CBC:  ?Recent Labs  ?Lab 02/22/22 ?2017 02/22/22 ?2022  ?WBC 5.7  --   ?NEUTROABS 3.1  --   ?HGB 14.9 15.3*  ?HCT 44.4 45.0  ?MCV 100.7*  --   ?PLT 109*  --   ? ?Basic Metabolic Panel:  ?Recent Labs  ?Lab 02/22/22 ?2017 02/22/22 ?2022  ?NA 139 140  ?K 3.6 3.7  ?CL 105 105  ?CO2 23  --   ?GLUCOSE 111* 108*  ?BUN 12 13  ?CREATININE 1.36* 1.30*  ?CALCIUM 9.1  --   ? ?Lipid Panel:  ?Recent Labs  ?Lab 02/22/22 ?2251  ?CHOL 188  ?TRIG 119  ?HDL 46  ?CHOLHDL 4.1  ?VLDL 24  ?LDLCALC 118*  ? ?HgbA1c:  ?Recent Labs  ?Lab 02/22/22 ?2251  ?HGBA1C 5.3  ? ?Urine Drug Screen:  ?Recent Labs  ?Lab 02/23/22 ?0112  ?LABOPIA NONE DETECTED  ?COCAINSCRNUR NONE DETECTED  ?LABBENZ NONE DETECTED  ?AMPHETMU NONE DETECTED  ?  THCU NONE DETECTED  ?LABBARB NONE DETECTED  ? ?Alcohol Level  ?Recent Labs  ?Lab 02/22/22 ?2017  ?ETH <10  ? ? ?IMAGING past 24 hours ?MR ANGIO HEAD WO CONTRAST ? ?Result Date: 02/23/2022 ?CLINICAL DATA:  Transient ischemic attack EXAM: MRI HEAD WITHOUT CONTRAST MRA HEAD WITHOUT CONTRAST TECHNIQUE: Multiplanar, multi-echo pulse sequences of the brain and surrounding structures were acquired without intravenous contrast. Angiographic images of the Circle of Willis were acquired using MRA technique without intravenous contrast. COMPARISON:  No pertinent prior exam. FINDINGS: MRI HEAD FINDINGS Brain: No acute infarct, mass effect or extra-axial collection. No acute or chronic  hemorrhage. There is multifocal hyperintense T2-weighted signal within the white matter. Parenchymal volume and CSF spaces are normal. The midline structures are normal. Vascular: Major flow voids are preserved. Skull and upper cervical spine: Normal calvarium and skull base. Visualized upper cervical spine and soft tissues are normal. Sinuses/Orbits:No paranasal sinus fluid levels or advanced mucosal thickening. No mastoid or middle ear effusion. Normal orbits. MRA HEAD FINDINGS POSTERIOR CIRCULATION: --Vertebral arteries: Normal --Inferior cerebellar arteries: Normal. --Basilar artery: Normal. --Superior cerebellar arteries: Normal. --Posterior cerebral arteries: Normal. ANTERIOR CIRCULATION: --Intracranial internal carotid arteries: Normal. --Anterior cerebral arteries (ACA): Normal. --Middle cerebral arteries (MCA): Normal. ANATOMIC VARIANTS: None IMPRESSION: 1. No acute intracranial abnormality. 2. Normal intracranial MRA. 3. Mild chronic small vessel disease. Electronically Signed   By: Deatra RobinsonKevin  Herman M.D.   On: 02/23/2022 03:00  ? ?MR BRAIN WO CONTRAST ? ?Result Date: 02/23/2022 ?CLINICAL DATA:  Transient ischemic attack EXAM: MRI HEAD WITHOUT CONTRAST MRA HEAD WITHOUT CONTRAST TECHNIQUE: Multiplanar, multi-echo pulse sequences of the brain and surrounding structures were acquired without intravenous contrast. Angiographic images of the Circle of Willis were acquired using MRA technique without intravenous contrast. COMPARISON:  No pertinent prior exam. FINDINGS: MRI HEAD FINDINGS Brain: No acute infarct, mass effect or extra-axial collection. No acute or chronic hemorrhage. There is multifocal hyperintense T2-weighted signal within the white matter. Parenchymal volume and CSF spaces are normal. The midline structures are normal. Vascular: Major flow voids are preserved. Skull and upper cervical spine: Normal calvarium and skull base. Visualized upper cervical spine and soft tissues are normal.  Sinuses/Orbits:No paranasal sinus fluid levels or advanced mucosal thickening. No mastoid or middle ear effusion. Normal orbits. MRA HEAD FINDINGS POSTERIOR CIRCULATION: --Vertebral arteries: Normal --Inferior cerebellar arteries: Normal. --Basilar artery: Normal. --Superior cerebellar arteries: Normal. --Posterior cerebral arteries: Normal. ANTERIOR CIRCULATION: --Intracranial internal carotid arteries: Normal. --Anterior cerebral arteries (ACA): Normal. --Middle cerebral arteries (MCA): Normal. ANATOMIC VARIANTS: None IMPRESSION: 1. No acute intracranial abnormality. 2. Normal intracranial MRA. 3. Mild chronic small vessel disease. Electronically Signed   By: Deatra RobinsonKevin  Herman M.D.   On: 02/23/2022 03:00  ? ?ECHOCARDIOGRAM COMPLETE ? ?Result Date: 02/23/2022 ?   ECHOCARDIOGRAM REPORT   Patient Name:   Felicia Weeks Date of Exam: 02/23/2022 Medical Rec #:  [id number]       Height:       66.0 in Accession #:    [id number]      Weight:       310.0 lb Date of Birth:  12/14/1960       BSA:          2.409 m? Patient Age:    60 years        BP:           185/75 mmHg Patient Gender: F               HR:  52 bpm. Exam Location:  Inpatient Procedure: 2D Echo, Cardiac Doppler, Color Doppler and Strain Analysis Indications:    TIA  History:        Patient has no prior history of Echocardiogram examinations.                 COPD and TIA; Risk Factors:Former Smoker.  Sonographer:    Neomia Dear RDCS Referring Phys: [id number] ERIC CHEN IMPRESSIONS  1. Left ventricular ejection fraction, by estimation, is 60 to 65%. The left ventricle has normal function. The left ventricle has no regional wall motion abnormalities. Left ventricular diastolic parameters were normal. The average left ventricular global longitudinal strain is -20.1 %. The global longitudinal strain is normal.  2. Right ventricular systolic function is normal. The right ventricular size is normal.  3. Left atrial size was mildly dilated.  4. The mitral valve is normal  in structure. No evidence of mitral valve regurgitation. No evidence of mitral stenosis.  5. Mean gradient across AV 10 peak 19 mmHg but AVA 2.3 cm2 . The aortic valve is tricuspid. There is moderate calcification of t

## 2022-02-23 NOTE — Evaluation (Signed)
Speech Language Pathology Evaluation ?Patient Details ?Name: Felicia Weeks ?MRN: [id number] ?DOB: 1961/11/26 ?Today's Date: 02/23/2022 ?Time: 1027-2536 ?SLP Time Calculation (min) (ACUTE ONLY): 13 min ? ?Problem List:  ?Patient Active Problem List  ? Diagnosis Date Noted  ? TIA (transient ischemic attack) 02/22/2022  ? Tobacco abuse 02/22/2022  ? Chronic idiopathic constipation 02/19/2022  ? COPD (chronic obstructive pulmonary disease) (HCC) 02/19/2022  ? Morbid obesity with BMI of 50.0-59.9, adult (HCC) 02/19/2022  ? Hyperlipidemia 12/11/2016  ? Essential hypertension 12/10/2016  ? ?Past Medical History:  ?Past Medical History:  ?Diagnosis Date  ? COPD (chronic obstructive pulmonary disease) (HCC)   ? DDD (degenerative disc disease)   ? Hypertension   ? ?Past Surgical History:  ?Past Surgical History:  ?Procedure Laterality Date  ? BACK SURGERY    ? ?HPI:  ?61 y.o.  female presented to ED with onset of slightly slurred speech, right arm and right leg numbness. Prior medical history of COPD, hypertension, hyperlipidemia, obesity. MRI was negative for acute event.  ? ?Assessment / Plan / Recommendation ?Clinical Impression ? Pt presents with resolved dysarthria. Speech is clear and fluent. Language is intact with no expressive/receptive aphasia.  Verbal problem solving, memory, orientation are WNL No SLP f/u is needed - our service will sign off. ?   ?SLP Assessment ? SLP Recommendation/Assessment: Patient does not need any further Speech Lanaguage Pathology Services ?SLP Visit Diagnosis: Cognitive communication deficit (R41.841)  ?  ?Recommendations for follow up therapy are one component of a multi-disciplinary discharge planning process, led by the attending physician.  Recommendations may be updated based on patient status, additional functional criteria and insurance authorization. ?   ?Follow Up Recommendations ? No SLP follow up  ?  ?    ?    ?    ?  ?  ?   ?SLP Evaluation ?Cognition ? Overall Cognitive Status:  Within Functional Limits for tasks assessed ?Arousal/Alertness: Awake/alert ?Orientation Level: Oriented X4 ?Attention: Alternating ?Alternating Attention: Appears intact ?Memory: Appears intact ?Awareness: Appears intact ?Problem Solving: Appears intact  ?  ?   ?Comprehension ? Auditory Comprehension ?Overall Auditory Comprehension: Appears within functional limits for tasks assessed ?Yes/No Questions: Within Functional Limits ?Commands: Within Functional Limits  ?  ?Expression Expression ?Primary Mode of Expression: Verbal ?Verbal Expression ?Overall Verbal Expression: Appears within functional limits for tasks assessed ?Initiation: No impairment ?Level of Generative/Spontaneous Verbalization: Conversation ?Repetition: No impairment ?Naming: No impairment ?Written Expression ?Dominant Hand: Right ?Written Expression: Not tested   ?Oral / Motor ? Oral Motor/Sensory Function ?Overall Oral Motor/Sensory Function: Within functional limits ?Motor Speech ?Overall Motor Speech: Appears within functional limits for tasks assessed   ?        ? ?Blenda Mounts Laurice ?02/23/2022, 11:36 AM ?Marchelle Folks L. Marcas Bowsher, MA CCC/SLP ?Acute Rehabilitation Services ?Office number [phone] ?Pager [phone] ? ? ?

## 2022-02-23 NOTE — ED Notes (Signed)
Pt stated she had to leave and could not wait for the walker to be delivered due to family waiting on her outside. Pt educated on the importance of walker and to talk to her PCP about resources.  ?

## 2022-02-23 NOTE — ED Notes (Signed)
Assisted patient to bathroom via w/c steady gait.  ?

## 2022-02-23 NOTE — ED Notes (Signed)
Placed breakfast order ?

## 2022-02-23 NOTE — Evaluation (Addendum)
Occupational Therapy Evaluation ?Patient Details ?Name: Felicia LiterJeanie D Weeks ?MRN: [id number] ?DOB: 05/10/1961 ?Today's Date: 02/23/2022 ? ? ?History of Present Illness Pt is a 61 y/o female presenting on 3/19 with slurred speech, R UE/LE numbness. CT negative, MRI negative. PMH includes: COPD, DDD, HTN, HLD.  ? ?Clinical Impression ?  ?PTA patient independent with ADLs, IADLs and mobility. Admitted for above and limited by problem list below, including mild R sided weakness and decreased sensation, decreased activity tolerance.  Pt demonstrates ability to complete LB dressing with mod assist, transfers and mobility to commode with min guard fading to supervision, and toileting/grooming with supervision.  Pt with decreased visual attention, noted R eye jumping to R when scanning towards L side --but pt denies visual deficits, although does reports some blurry vision at distance reading (this maybe baseline).  Pt has good support at home, does take care of grandkids and drives.  Discussed safety and recommendations. Based on performance today, believe she will progress well and anticipate no further needs after dc home.  Will follow acutely to optimize independence and return to PLOF.   ?  ?BP: supine 197/70 ?        EOB [birth year] ?        Standing 176/79 ?        after toileting 193/61  ? ?Recommendations for follow up therapy are one component of a multi-disciplinary discharge planning process, led by the attending physician.  Recommendations may be updated based on patient status, additional functional criteria and insurance authorization.  ? ?Follow Up Recommendations ? No OT follow up  ?  ?Assistance Recommended at Discharge Intermittent Supervision/Assistance  ?Patient can return home with the following A little help with bathing/dressing/bathroom;A little help with walking and/or transfers;Assist for transportation ? ?  ?Functional Status Assessment ? Patient has had a recent decline in their functional status and  demonstrates the ability to make significant improvements in function in a reasonable and predictable amount of time.  ?Equipment Recommendations ? None recommended by OT  ?  ?Recommendations for Other Services PT consult ? ? ?  ?Precautions / Restrictions Precautions ?Precautions: Fall ?Restrictions ?Weight Bearing Restrictions: No  ? ?  ? ?Mobility Bed Mobility ?Overal bed mobility: Needs Assistance ?Bed Mobility: Sit to Supine, Supine to Sit ?  ?  ?Supine to sit: Min assist ?Sit to supine: Min guard ?  ?General bed mobility comments: min assist to ascend trunk from stretcher, min guard to return for safety ?  ? ?Transfers ?  ?  ?  ?  ?  ?  ?  ?  ?  ?  ?  ? ?  ?Balance Overall balance assessment: Mild deficits observed, not formally tested ?  ?  ?  ?  ?  ?  ?  ?  ?  ?  ?  ?  ?  ?  ?  ?  ?  ?  ?   ? ?ADL either performed or assessed with clinical judgement  ? ?ADL Overall ADL's : Needs assistance/impaired ?  ?  ?Grooming: Supervision/safety;Standing ?  ?  ?  ?  ?  ?Upper Body Dressing : Set up;Sitting ?  ?Lower Body Dressing: Sit to/from stand;Moderate assistance ?Lower Body Dressing Details (indicate cue type and reason): requires assist to socks to due to elevated stretcher height ?Toilet Transfer: Min guard;Ambulation ?  ?Toileting- Clothing Manipulation and Hygiene: Supervision/safety;Sit to/from stand ?  ?  ?  ?Functional mobility during ADLs: Min guard;Supervision/safety ?General ADL Comments: min guard  fading to supervision for safety  ? ? ? ?Vision Baseline Vision/History: 1 Wears glasses (reading) ?Ability to See in Adequate Light: 0 Adequate ?Patient Visual Report: No change from baseline ?Vision Assessment?: Yes ?Eye Alignment: Within Functional Limits ?Ocular Range of Motion: Within Functional Limits ?Alignment/Gaze Preference: Within Defined Limits ?Tracking/Visual Pursuits: Other (comment) (tracking with R eye jumping to R when scanning to L) ?Visual Fields: No apparent deficits ?Additional Comments:  able to read distance and close up, reports some blurry vision with smaller print at distance but has been planning to go to the eye dr  ?   ?Perception   ?  ?Praxis   ?  ? ?Pertinent Vitals/Pain Pain Assessment ?Pain Assessment: 0-10 ?Pain Score: 5  ?Pain Location: ha ?Pain Descriptors / Indicators: Headache ?Pain Intervention(s): Limited activity within patient's tolerance, Monitored during session, Repositioned  ? ? ? ?Hand Dominance Right ?  ?Extremity/Trunk Assessment Upper Extremity Assessment ?Upper Extremity Assessment: RUE deficits/detail ?RUE Deficits / Details: mild weakness 4-/5 (compared to 4+/5 L UE), decreased sensation ?RUE Sensation: decreased light touch ?RUE Coordination: WNL ?  ?Lower Extremity Assessment ?Lower Extremity Assessment: Defer to PT evaluation ?  ?  ?  ?Communication Communication ?Communication: No difficulties ?  ?Cognition Arousal/Alertness: Awake/alert ?Behavior During Therapy: [facility] for tasks assessed/performed ?Overall Cognitive Status: Within Functional Limits for tasks assessed ?  ?  ?  ?  ?  ?  ?  ?  ?  ?  ?  ?  ?  ?  ?  ?  ?  ?  ?  ?General Comments  discussed safety, vision recommendations and not driving until cleared by MD. Encouraged maximal use of R UE. ? ?  ?Exercises   ?  ?Shoulder Instructions    ? ? ?Home Living Family/patient expects to be discharged to:: Private residence ?Living Arrangements: Children (daughter and her fiancee- grandkids (5th grade, preschool)) ?Available Help at Discharge: Family ?Type of Home: House ?Home Access: Stairs to enter ?Entrance Stairs-Number of Steps: 3 ?Entrance Stairs-Rails: None ?Home Layout: One level ?  ?  ?Bathroom Shower/Tub: Tub/shower unit ?  ?Bathroom Toilet: Standard ?  ?  ?Home Equipment: None ?  ?  ?  ? ?  ?Prior Functioning/Environment Prior Level of Function : Independent/Modified Independent;Driving ?  ?  ?  ?  ?  ?  ?  ?ADLs Comments: ind ADLs/IADLs ?  ? ?  ?  ?OT Problem List: Decreased strength;Impaired  vision/perception;Decreased knowledge of precautions;Impaired sensation ?  ?   ?OT Treatment/Interventions: Self-care/ADL training;DME and/or AE instruction;Neuromuscular education;Therapeutic activities;Visual/perceptual remediation/compensation;Patient/family education  ?  ?OT Goals(Current goals can be found in the care plan section) Acute Rehab OT Goals ?Patient Stated Goal: to feel back to normal ?OT Goal Formulation: With patient ?Time For Goal Achievement: 03/09/22 ?Potential to Achieve Goals: Good  ?OT Frequency: Min 2X/week ?  ? ?Co-evaluation   ?  ?  ?  ?  ? ?  ?AM-PAC OT "6 Clicks" Daily Activity     ?Outcome Measure Help from another person eating meals?: A Little ?Help from another person taking care of personal grooming?: A Little ?Help from another person toileting, which includes using toliet, bedpan, or urinal?: A Little ?Help from another person bathing (including washing, rinsing, drying)?: A Little ?Help from another person to put on and taking off regular upper body clothing?: A Little ?Help from another person to put on and taking off regular lower body clothing?: A Lot ?6 Click Score: 17 ?  ?End of Session  Nurse Communication: Mobility status ? ?Activity Tolerance: Patient tolerated treatment well ?Patient left: in bed;with call bell/phone within reach ? ?OT Visit Diagnosis: Other abnormalities of gait and mobility (R26.89);Other symptoms and signs involving the nervous system (R29.898)  ?              ?Time: [phone] ?OT Time Calculation (min): 32 min ?Charges:  OT General Charges ?$OT Visit: 1 Visit ?OT Evaluation ?$OT Eval Moderate Complexity: 1 Mod ?OT Treatments ?$Self Care/Home Management : 8-22 mins ? ?Barry Brunner, OT ?Acute Rehabilitation Services ?Pager [phone] ?Office [phone] ? ? ?Chancy Milroy ?02/23/2022, 10:09 AM ?

## 2022-04-14 ENCOUNTER — Ambulatory Visit (INDEPENDENT_AMBULATORY_CARE_PROVIDER_SITE_OTHER): Payer: Medicare Other | Admitting: Family Medicine

## 2022-04-14 ENCOUNTER — Encounter: Payer: Self-pay | Admitting: Family Medicine

## 2022-04-14 VITALS — BP 132/72 | HR 56 | Ht 66.0 in | Wt 312.0 lb

## 2022-04-14 DIAGNOSIS — E785 Hyperlipidemia, unspecified: Secondary | ICD-10-CM

## 2022-04-14 DIAGNOSIS — G8929 Other chronic pain: Secondary | ICD-10-CM

## 2022-04-14 DIAGNOSIS — I10 Essential (primary) hypertension: Secondary | ICD-10-CM | POA: Diagnosis not present

## 2022-04-14 DIAGNOSIS — Z720 Tobacco use: Secondary | ICD-10-CM

## 2022-04-14 DIAGNOSIS — G459 Transient cerebral ischemic attack, unspecified: Secondary | ICD-10-CM

## 2022-04-14 DIAGNOSIS — Z6841 Body Mass Index (BMI) 40.0 and over, adult: Secondary | ICD-10-CM

## 2022-04-14 DIAGNOSIS — R519 Headache, unspecified: Secondary | ICD-10-CM

## 2022-04-14 NOTE — Patient Instructions (Signed)
Below is our plan: ? ?TIA versus complicated migraine : Residual deficit: none. Continue aspirin 81 mg daily  and atorvastatin 80mg  for secondary stroke prevention.  Discussed secondary stroke prevention measures and importance of close PCP follow up for aggressive stroke risk factor management. I have gone over the pathophysiology of stroke, warning signs and symptoms, risk factors and their management in some detail with instructions to go to the closest emergency room for symptoms of concern. ?HTN: BP goal <130/90.  Stable on lisinopril-hctz 20-12.5mg  daily and metoprolol 25mg  BID per PCP ?HLD: LDL goal <70. Recent LDL 118. Atorvastatin was increased to 80mg  in hospital. Last reading with PCP was 79! Keep up with good work. Continue per PCP.  ?DMII: A1c goal<7.0. Recent A1c 5.3.  ?Chronic headaches: consider discussion with PCP regarding headache prevention medication versus sleep evaluation if you feel sleep apnea may be present. Consider updating eye exam. Consider keeping a food journal to see if foods are correlated. Try to drink 50-60 ounces of water every day.  ?Tobacco abuse with COPD: recommend cessation ?Obesity: healthy lifestyle habits with well balanced diet and regular physical exercise.  ? ? ?Please make sure you are staying well hydrated. I recommend 50-60 ounces daily. Well balanced diet and regular exercise encouraged. Consistent sleep schedule with 6-8 hours recommended.  ? ?Please continue follow up with care team as directed.  ? ?Follow up with me in 6 months  ? ?You may receive a survey regarding today's visit. I encourage you to leave honest feed back as I do use this information to improve patient care. Thank you for seeing me today!  ? ? ?

## 2022-04-14 NOTE — Progress Notes (Signed)
?[facility] ?[id number] ?[place]. [place] [address] ?[phone] ? ?     HOSPITAL FOLLOW UP NOTE ? ?Ms. Felicia Weeks ?Date of Birth:  1961/12/07 ?Medical Record Number:  [id number]  ? ?Reason for Referral:  hospital stroke follow up ? ? ? ?SUBJECTIVE: ? ? ?CHIEF COMPLAINT:  ?Chief Complaint  ?Patient presents with  ? Follow-up  ?  Rm 1, w daughter. Here to f/u from recent hospital visit on 3/19. Pt reports having a HA about every day usually in the morning or late in the day. Takes Tylenol with relief.   ? ? ?HPI:  ? ?Felicia Weeks is a 61 y.o. who  has a past medical history of COPD (chronic obstructive pulmonary disease) (HCC), DDD (degenerative disc disease), Hypertension, and TIA (transient ischemic attack).  Patient presented on 02/22/2022 with right lip, arm and leg numbness. Symptoms lasted about 10 minutes and resolved spontaneously. Symptoms started in setting of chronic bifrontal headache. She has history of chronic headaches. Imaging was unremarkable. BP was elevated. Dapt with asa 81mg  and plavix recommended for 3 weeks then asa 81mg  daily. Personally reviewed hospitalization pertinent progress notes, lab work and imaging.  Evaluated by Dr .  ? ?Since discharge, she reports doing well. She does have some residual right lip numbness. Otherwise doing ok. She has seen PCP. BP meds were increased. She reports BP has been a little better at home.  ? ?She has chronic daily headaches. She has had headaches for years. She is not on headache prevention medications. Most are bifrontal and worse in the morning. Usually gets a little better when she gets up and moves around. She has had a few that worsen with exposure to light and with activity. She has not discussed wit PCP. She is unsure if she snores. Her daughter does not think so.  She does fall asleep easily if sitting still. She endorses significant fatigue. She wakes often to urinate. She usually goes to bed late at night and  sleeps late in the mornings. She does smoke.  ? ? ?PERTINENT IMAGING/LABS ? ?Code Stroke CT head: No acute abnormality.ASPECTS 10.  ?MRI: No acute intracranial abnormality. Mild chronic small vessel disease. ?MRA: Normal intracranial MRA. ?Carotid Doppler: B/l ICA are consistent with a 1-39% stenosis. Bilateral vertebral arteries demonstrate antegrade flow. Normal flow hemodynamics were seen in bilateral subclavian arteries ?2D Echo: LVEF 60 to 65%, no interatrial shunt ? ? ?A1C ?Lab Results  ?Component Value Date  ? HGBA1C 5.3 02/22/2022  ? ? ?Lipid Panel  ?   ?Component Value Date/Time  ? CHOL 188 02/22/2022 2251  ? CHOL 248 (H) 12/10/2016 1208  ? TRIG 119 02/22/2022 2251  ? HDL 46 02/22/2022 2251  ? HDL 47 12/10/2016 1208  ? CHOLHDL 4.1 02/22/2022 2251  ? VLDL 24 02/22/2022 2251  ? LDLCALC 118 (H) 02/22/2022 2251  ? LDLCALC 158 (H) 12/10/2016 1208  ? LABVLDL 43 (H) 12/10/2016 1208  ? ? ? ? ?ROS:   ?14 system review of systems performed and negative with exception of those listed in HPI ? ?PMH:  ?Past Medical History:  ?Diagnosis Date  ? COPD (chronic obstructive pulmonary disease) (HCC)   ? DDD (degenerative disc disease)   ? Hypertension   ? TIA (transient ischemic attack)   ? ? ?PSH:  ?Past Surgical History:  ?Procedure Laterality Date  ? BACK SURGERY    ? ? ?Social History:  ?Social History  ? ?Socioeconomic History  ? Marital status: Single  ?  Spouse name: Not on file  ? Number of children: 1  ? Years of education: Not on file  ? Highest education level: High school graduate  ?Occupational History  ? Not on file  ?Tobacco Use  ? Smoking status: Every Day  ?  Packs/day: 1.00  ?  Types: Cigarettes  ?  Start date: 12/07/1980  ? Smokeless tobacco: Never  ?Vaping Use  ? Vaping Use: Never used  ?Substance and Sexual Activity  ? Alcohol use: No  ? Drug use: No  ? Sexual activity: Not on file  ?Other Topics Concern  ? Not on file  ?Social History Narrative  ? Lives with daughter  ? R handed  ? Caffeine: 3 C of coffee  in the morning, 3x a week. Teas are occas. 2-3 16oz mountain dews a day  ? ?Social Determinants of Health  ? ?Financial Resource Strain: Not on file  ?Food Insecurity: Not on file  ?Transportation Needs: Not on file  ?Physical Activity: Not on file  ?Stress: Not on file  ?Social Connections: Not on file  ?Intimate Partner Violence: Not on file  ? ? ?Family History:  ?Family History  ?Problem Relation Age of Onset  ? Stroke Mother   ? Liver disease Father   ? ? ?Medications:   ?Current Outpatient Medications on File Prior to Visit  ?Medication Sig Dispense Refill  ? acetaminophen (TYLENOL) 500 MG tablet Take 500 mg by mouth every 6 (six) hours as needed for moderate pain or headache.    ? aspirin EC 81 MG EC tablet Take 1 tablet (81 mg total) by mouth daily. Swallow whole. 30 tablet 2  ? atorvastatin (LIPITOR) 80 MG tablet Take 1 tablet (80 mg total) by mouth daily. 30 tablet 2  ? ibuprofen (ADVIL) 200 MG tablet Take 200 mg by mouth every 6 (six) hours as needed for headache or moderate pain.    ? lisinopril-hydrochlorothiazide (ZESTORETIC) 20-12.5 MG tablet Take 1 tablet by mouth daily. 90 tablet 1  ? metoprolol tartrate (LOPRESSOR) 25 MG tablet Take 1 tablet (25 mg total) by mouth 2 (two) times daily. 180 tablet 3  ? ?No current facility-administered medications on file prior to visit.  ? ? ?Allergies:   ?Allergies  ?Allergen Reactions  ? Benadryl [Diphenhydramine] Swelling  ? Vicodin [Hydrocodone-Acetaminophen] Swelling  ? ? ? ? ?OBJECTIVE: ? ?Physical Exam ? ?Vitals:  ? 04/14/22 0913  ?BP: 132/72  ?Pulse: (!) 56  ?Weight: (!) 312 lb (141.5 kg)  ?Height: 5\' 6"  (1.676 m)  ? ?Body mass index is 50.36 kg/m?Marland Kitchen. ?No results found. ? ? ?  08/31/2016  ?  5:24 PM  ?Depression screen PHQ 2/9  ?Decreased Interest 0  ?Down, Depressed, Hopeless 0  ?PHQ - 2 Score 0  ?  ? ?General: well developed, well nourished, seated, in no evident distress ?Head: head normocephalic and atraumatic.   ?Neck: supple with no carotid or  supraclavicular bruits ?Cardiovascular: regular rate and rhythm, no murmurs ?Musculoskeletal: no deformity ?Skin:  no rash/petichiae ?Vascular:  Normal pulses all extremities ?  ?Neurologic Exam ?Mental Status: Awake and fully alert.  Fluent speech and language.  Oriented to place and time. Recent and remote memory intact. Attention span, concentration and fund of knowledge appropriate. Mood and affect appropriate.  ?Cranial Nerves: Fundoscopic exam reveals sharp disc margins. Pupils equal, briskly reactive to light. Extraocular movements full without nystagmus. Visual fields full to confrontation. Hearing intact. Facial sensation intact. Face, tongue, palate moves normally and symmetrically.  ?Motor: Normal  bulk and tone. Normal strength in all tested extremity muscles ?Sensory.: intact to touch , pinprick , position and vibratory sensation.  ?Coordination: Finger-to-nose and heel-to-shin performed accurately bilaterally. ?Gait and Station: Arises from chair without difficulty. Stance is normal. Gait demonstrates normal stride length and balance with no assistive device.  ?Reflexes: 1+ and symmetric.  ? ? ?NIHSS  1 ?Modified Rankin  0 ? ? ?ASSESSMENT: Felicia Weeks is a 61 y.o. year old female presented with right sided numbness of lip, upper and lower extremity on 3/19. Vascular risk factors include HTN, HLD, Obesity, tobacco abuse, family history stroke.  ? ?PLAN: ? ?TIA versus complicated migraine : Residual deficit: right lip numbness. Continue aspirin 81 mg daily  and atorvastatin 80mg  for secondary stroke prevention.  Discussed secondary stroke prevention measures and importance of close PCP follow up for aggressive stroke risk factor management. I have gone over the pathophysiology of stroke, warning signs and symptoms, risk factors and their management in some detail with instructions to go to the closest emergency room for symptoms of concern. ?HTN: BP goal <130/90.  Stable on lisinopril-hctz 20-12.5mg   daily and metoprolol 25mg  BID per PCP ?HLD: LDL goal <70. Recent LDL 118. Atorvastatin was increased to 80mg  in hospital. LDL 79 with recheck 03/06/2022. Continue per PCP.  ?DMII: A1c goal<7.0. Recent A1c 5.3.  ?Chron

## 2022-04-16 NOTE — Progress Notes (Signed)
I agree with the above plan 

## 2022-10-19 NOTE — Progress Notes (Incomplete)
[facility] [address] [place]. [place] [address] [phone]       HOSPITAL FOLLOW UP NOTE  Ms. Felicia Weeks Date of Birth:  16-May-1961 Medical Record Number:  [id number]   Reason for Referral:  hospital stroke follow up    SUBJECTIVE:   CHIEF COMPLAINT:  No chief complaint on file.   HPI:   Felicia Weeks is a 61 y.o. who  has a past medical history of COPD (chronic obstructive pulmonary disease) (HCC), DDD (degenerative disc disease), Hypertension, and TIA (transient ischemic attack).  Patient presented on 02/22/2022 with right lip, arm and leg numbness. Symptoms lasted about 10 minutes and resolved spontaneously. Symptoms started in setting of chronic bifrontal headache. She has history of chronic headaches. Imaging was unremarkable. BP was elevated. Dapt with asa 81mg  and plavix recommended for 3 weeks then asa 81mg  daily. Personally reviewed hospitalization pertinent progress notes, lab work and imaging.  Evaluated by Dr .   Since discharge, she reports doing well. She does have some residual right lip numbness. Otherwise doing ok. She has seen PCP. BP meds were increased. She reports BP has been a little better at home.   She has chronic daily headaches. She has had headaches for years. She is not on headache prevention medications. Most are bifrontal and worse in the morning. Usually gets a little better when she gets up and moves around. She has had a few that worsen with exposure to light and with activity. She has not discussed wit PCP. She is unsure if she snores. Her daughter does not think so.  She does fall asleep easily if sitting still. She endorses significant fatigue. She wakes often to urinate. She usually goes to bed late at night and sleeps late in the mornings. She does smoke.   UPDATE 10/19/2022 ALL: Felicia Weeks returns for follow up for CVA.    PERTINENT IMAGING/LABS  Code Stroke CT head: No acute abnormality.ASPECTS 10.  MRI: No  acute intracranial abnormality. Mild chronic small vessel disease. MRA: Normal intracranial MRA. Carotid Doppler: B/l ICA are consistent with a 1-39% stenosis. Bilateral vertebral arteries demonstrate antegrade flow. Normal flow hemodynamics were seen in bilateral subclavian arteries 2D Echo: LVEF 60 to 65%, no interatrial shunt   A1C Lab Results  Component Value Date   HGBA1C 5.3 02/22/2022    Lipid Panel     Component Value Date/Time   CHOL 188 02/22/2022 2251   CHOL 248 (H) 12/10/2016 1208   TRIG 119 02/22/2022 2251   HDL 46 02/22/2022 2251   HDL 47 12/10/2016 1208   CHOLHDL 4.1 02/22/2022 2251   VLDL 24 02/22/2022 2251   LDLCALC 118 (H) 02/22/2022 2251   LDLCALC 158 (H) 12/10/2016 1208   LABVLDL 43 (H) 12/10/2016 1208      ROS:   14 system review of systems performed and negative with exception of those listed in HPI  PMH:  Past Medical History:  Diagnosis Date   COPD (chronic obstructive pulmonary disease) (HCC)    DDD (degenerative disc disease)    Hypertension    TIA (transient ischemic attack)     PSH:  Past Surgical History:  Procedure Laterality Date   BACK SURGERY      Social History:  Social History   Socioeconomic History   Marital status: Single    Spouse name: Not on file   Number of children: 1   Years of education: Not on file   Highest education level: High school graduate  Occupational History   Not on file  Tobacco Use   Smoking status: Every Day    Packs/day: 1.00    Types: Cigarettes    Start date: 12/07/1980   Smokeless tobacco: Never  Vaping Use   Vaping Use: Never used  Substance and Sexual Activity   Alcohol use: No   Drug use: No   Sexual activity: Not on file  Other Topics Concern   Not on file  Social History Narrative   Lives with daughter   R handed   Caffeine: 3 C of coffee in the morning, 3x a week. Teas are occas. 2-3 16oz mountain dews a day   Social Determinants of Corporate investment banker Strain: Not  on file  Food Insecurity: Not on file  Transportation Needs: Not on file  Physical Activity: Not on file  Stress: Not on file  Social Connections: Not on file  Intimate Partner Violence: Not on file    Family History:  Family History  Problem Relation Age of Onset   Stroke Mother    Liver disease Father     Medications:   Current Outpatient Medications on File Prior to Visit  Medication Sig Dispense Refill   acetaminophen (TYLENOL) 500 MG tablet Take 500 mg by mouth every 6 (six) hours as needed for moderate pain or headache.     atorvastatin (LIPITOR) 80 MG tablet Take 1 tablet (80 mg total) by mouth daily. 30 tablet 2   ibuprofen (ADVIL) 200 MG tablet Take 200 mg by mouth every 6 (six) hours as needed for headache or moderate pain.     lisinopril-hydrochlorothiazide (ZESTORETIC) 20-12.5 MG tablet Take 1 tablet by mouth daily. 90 tablet 1   metoprolol tartrate (LOPRESSOR) 25 MG tablet Take 1 tablet (25 mg total) by mouth 2 (two) times daily. 180 tablet 3   No current facility-administered medications on file prior to visit.    Allergies:   Allergies  Allergen Reactions   Benadryl [Diphenhydramine] Swelling   Vicodin [Hydrocodone-Acetaminophen] Swelling      OBJECTIVE:  Physical Exam  There were no vitals filed for this visit.  There is no height or weight on file to calculate BMI. No results found.     08/31/2016    5:24 PM  Depression screen PHQ 2/9  Decreased Interest 0  Down, Depressed, Hopeless 0  PHQ - 2 Score 0     General: well developed, well nourished, seated, in no evident distress Head: head normocephalic and atraumatic.   Neck: supple with no carotid or supraclavicular bruits Cardiovascular: regular rate and rhythm, no murmurs Musculoskeletal: no deformity Skin:  no rash/petichiae Vascular:  Normal pulses all extremities   Neurologic Exam Mental Status: Awake and fully alert.  Fluent speech and language.  Oriented to place and time. Recent  and remote memory intact. Attention span, concentration and fund of knowledge appropriate. Mood and affect appropriate.  Cranial Nerves: Fundoscopic exam reveals sharp disc margins. Pupils equal, briskly reactive to light. Extraocular movements full without nystagmus. Visual fields full to confrontation. Hearing intact. Facial sensation intact. Face, tongue, palate moves normally and symmetrically.  Motor: Normal bulk and tone. Normal strength in all tested extremity muscles Sensory.: intact to touch , pinprick , position and vibratory sensation.  Coordination: Finger-to-nose and heel-to-shin performed accurately bilaterally. Gait and Station: Arises from chair without difficulty. Stance is normal. Gait demonstrates normal stride length and balance with no assistive device.  Reflexes: 1+ and symmetric.    NIHSS  1 Modified  Rankin  0   ASSESSMENT: Felicia Weeks is a 61 y.o. year old female presented with right sided numbness of lip, upper and lower extremity on 3/19. Vascular risk factors include HTN, HLD, Obesity, tobacco abuse, family history stroke.   PLAN:  TIA versus complicated migraine : Residual deficit: right lip numbness. Continue aspirin 81 mg daily  and atorvastatin 80mg  for secondary stroke prevention.  Discussed secondary stroke prevention measures and importance of close PCP follow up for aggressive stroke risk factor management. I have gone over the pathophysiology of stroke, warning signs and symptoms, risk factors and their management in some detail with instructions to go to the closest emergency room for symptoms of concern. HTN: BP goal <130/90.  Stable on lisinopril-hctz 20-12.5mg  daily and metoprolol 25mg  BID per PCP HLD: LDL goal <70. Recent LDL 118. Atorvastatin was increased to 80mg  in hospital. LDL 79 with recheck 03/06/2022. Continue per PCP.  DMII: A1c goal<7.0. Recent A1c 5.3.  Chronic headaches: likely mixed type (tension and migrainous): discuss prevention  medicaitons with PCP. May consider sleep referral for evaluation of sleep apnea. Daughter has severe sleep apnea on CPAP. Advised to update eye exam.  Tobacco abuse with COPD: recommend cessation Obesity: healthy lifestyle habits with well balanced diet and regular physical exercise.    Follow up in 6 months or call earlier if needed   CC:  [facility] provider: Dr. PCP: , NP    I spent 45 minutes of face-to-face and non-face-to-face time with patient.  This included previsit chart review including review of recent hospitalization, lab review, study review, order entry, electronic health record documentation, patient education regarding recent stroke including etiology, secondary stroke prevention measures and importance of managing stroke risk factors, residual deficits and typical recovery time and answered all other questions to patient satisfaction   03/08/2022, [facility]  [facility] [address] [place], [facility] [address]  Phone [phone] Fax [phone] Note: This document was prepared with digital dictation and possible smart phrase technology. Any transcriptional errors that result from this process are unintentional.

## 2022-10-20 ENCOUNTER — Encounter: Payer: Self-pay | Admitting: Family Medicine

## 2022-10-20 ENCOUNTER — Ambulatory Visit: Payer: Medicare Other | Admitting: Family Medicine

## 2024-01-04 ENCOUNTER — Other Ambulatory Visit: Payer: Self-pay

## 2024-01-04 ENCOUNTER — Emergency Department (HOSPITAL_COMMUNITY): Payer: 59

## 2024-01-04 ENCOUNTER — Encounter (HOSPITAL_COMMUNITY): Payer: Self-pay | Admitting: *Deleted

## 2024-01-04 ENCOUNTER — Emergency Department (HOSPITAL_COMMUNITY)
Admission: EM | Admit: 2024-01-04 | Discharge: 2024-01-04 | Disposition: A | Discharge status: Home / Self Care | Payer: 59 | Attending: Emergency Medicine | Admitting: Emergency Medicine

## 2024-01-04 DIAGNOSIS — R030 Elevated blood-pressure reading, without diagnosis of hypertension: Secondary | ICD-10-CM

## 2024-01-04 DIAGNOSIS — G543 Thoracic root disorders, not elsewhere classified: Secondary | ICD-10-CM | POA: Insufficient documentation

## 2024-01-04 DIAGNOSIS — R911 Solitary pulmonary nodule: Secondary | ICD-10-CM | POA: Diagnosis not present

## 2024-01-04 DIAGNOSIS — M546 Pain in thoracic spine: Secondary | ICD-10-CM | POA: Diagnosis present

## 2024-01-04 DIAGNOSIS — Z79899 Other long term (current) drug therapy: Secondary | ICD-10-CM | POA: Insufficient documentation

## 2024-01-04 DIAGNOSIS — I10 Essential (primary) hypertension: Secondary | ICD-10-CM

## 2024-01-04 DIAGNOSIS — M5414 Radiculopathy, thoracic region: Secondary | ICD-10-CM

## 2024-01-04 DIAGNOSIS — N1832 Chronic kidney disease, stage 3b: Secondary | ICD-10-CM | POA: Insufficient documentation

## 2024-01-04 DIAGNOSIS — T148XXA Other injury of unspecified body region, initial encounter: Secondary | ICD-10-CM

## 2024-01-04 DIAGNOSIS — I129 Hypertensive chronic kidney disease with stage 1 through stage 4 chronic kidney disease, or unspecified chronic kidney disease: Secondary | ICD-10-CM | POA: Diagnosis not present

## 2024-01-04 LAB — BASIC METABOLIC PANEL
Anion gap: 12 (ref 5–15)
BUN: 20 mg/dL (ref 8–23)
CO2: 24 mmol/L (ref 22–32)
Calcium: 9.9 mg/dL (ref 8.9–10.3)
Chloride: 101 mmol/L (ref 98–111)
Creatinine, Ser: 1.64 mg/dL — ABNORMAL HIGH (ref 0.44–1.00)
GFR, Estimated: 35 mL/min — ABNORMAL LOW (ref >60)
Glucose, Bld: 103 mg/dL — ABNORMAL HIGH (ref 70–99)
Potassium: 4.7 mmol/L (ref 3.5–5.1)
Sodium: 137 mmol/L (ref 135–145)

## 2024-01-04 LAB — CBC
HCT: 46.3 % — ABNORMAL HIGH (ref 36.0–46.0)
Hemoglobin: 15.3 g/dL — ABNORMAL HIGH (ref 12.0–15.0)
MCH: 33 pg (ref 26.0–34.0)
MCHC: 33 g/dL (ref 30.0–36.0)
MCV: 99.8 fL (ref 80.0–100.0)
Platelets: 144 10*3/uL — ABNORMAL LOW (ref 150–400)
RBC: 4.64 MIL/uL (ref 3.87–5.11)
RDW: 12.4 % (ref 11.5–15.5)
WBC: 5.9 10*3/uL (ref 4.0–10.5)
nRBC: 0 % (ref 0.0–0.2)

## 2024-01-04 LAB — D-DIMER, QUANTITATIVE: D-Dimer, Quant: 0.84 ug{FEU}/mL — ABNORMAL HIGH (ref 0.00–0.50)

## 2024-01-04 MED ORDER — HYDROCHLOROTHIAZIDE 12.5 MG PO TABS
12.5000 mg | ORAL_TABLET | Freq: Once | ORAL | Status: AC
  Administered 2024-01-04: 12.5 mg via ORAL
  Filled 2024-01-04: qty 1

## 2024-01-04 MED ORDER — LACTATED RINGERS IV BOLUS
1000.0000 mL | Freq: Once | INTRAVENOUS | Status: AC
  Administered 2024-01-04: 1000 mL via INTRAVENOUS

## 2024-01-04 MED ORDER — METHOCARBAMOL 750 MG PO TABS
750.0000 mg | ORAL_TABLET | Freq: Three times a day (TID) | ORAL | 0 refills | Status: AC | PRN
Start: 2024-01-04 — End: ?

## 2024-01-04 MED ORDER — IOHEXOL 350 MG/ML SOLN
60.0000 mL | Freq: Once | INTRAVENOUS | Status: AC | PRN
  Administered 2024-01-04: 60 mL via INTRAVENOUS

## 2024-01-04 MED ORDER — LISINOPRIL 10 MG PO TABS
20.0000 mg | ORAL_TABLET | Freq: Once | ORAL | Status: AC
  Administered 2024-01-04: 20 mg via ORAL
  Filled 2024-01-04: qty 2

## 2024-01-04 MED ORDER — ACETAMINOPHEN 500 MG PO TABS
1000.0000 mg | ORAL_TABLET | Freq: Once | ORAL | Status: AC
  Administered 2024-01-04: 1000 mg via ORAL
  Filled 2024-01-04: qty 2

## 2024-01-04 MED ORDER — LISINOPRIL-HYDROCHLOROTHIAZIDE 20-12.5 MG PO TABS: 1.0000 | ORAL_TABLET | Freq: Once | ORAL | Status: DC

## 2024-01-04 MED ORDER — IBUPROFEN 400 MG PO TABS
400.0000 mg | ORAL_TABLET | Freq: Once | ORAL | Status: AC
  Administered 2024-01-04: 400 mg via ORAL
  Filled 2024-01-04: qty 1

## 2024-01-04 MED ORDER — METOPROLOL TARTRATE 25 MG PO TABS
25.0000 mg | ORAL_TABLET | Freq: Once | ORAL | Status: AC
  Administered 2024-01-04: 25 mg via ORAL
  Filled 2024-01-04: qty 1

## 2024-01-04 NOTE — ED Provider Notes (Signed)
[place] EMERGENCY DEPARTMENT AT [facility] Provider Note   CSN: [id number] Arrival date & time: 01/04/24  0747     History  Chief Complaint  Patient presents with   Back Pain    Felicia Weeks is a 63 y.o. female.  Patient c/o pain to area along medial/inferior border of right scapula that wraps around laterally and occasionally shoots towards left posterior shoulder/upper arm area or right trapezius area. Pain present in past month. Denies specific injury or strain. Indicates some times pain milder, at other times worse. Pain at rest, not related to specific movement, activity or exertion. No fever or chills. No numbness/weakness to arm. No pleuritic pain. No 'anterior' pain, no chest pain. No new or worsening cough. No sob. No leg pain or swelling.   The history is provided by the patient and medical records.  Back Pain Associated symptoms: no abdominal pain, no chest pain, no fever, no numbness and no weakness        Home Medications Prior to Admission medications   Medication Sig Start Date End Date Taking? Authorizing Provider  acetaminophen (TYLENOL) 500 MG tablet Take 500 mg by mouth every 6 (six) hours as needed for moderate pain or headache.    [provider]  atorvastatin (LIPITOR) 80 MG tablet Take 1 tablet (80 mg total) by mouth daily. 02/24/22 05/25/22  [place], Alvira Philips, DO  lisinopril-hydrochlorothiazide (ZESTORETIC) 20-12.5 MG tablet Take 1 tablet by mouth daily. 12/10/16   Daphine Deutscher Mary-Margaret, FNP  metoprolol tartrate (LOPRESSOR) 25 MG tablet Take 1 tablet (25 mg total) by mouth 2 (two) times daily. 08/31/16   Bennie Pierini, FNP      Allergies    Benadryl [diphenhydramine] and Vicodin [hydrocodone-acetaminophen]    Review of Systems   Review of Systems  Constitutional:  Negative for chills and fever.  Respiratory:  Negative for cough and shortness of breath.   Cardiovascular:  Negative for chest pain and leg swelling.   Gastrointestinal:  Negative for abdominal pain, nausea and vomiting.  Musculoskeletal:  Positive for back pain.  Skin:  Negative for rash and wound.  Neurological:  Negative for weakness and numbness.    Physical Exam Updated Vital Signs BP (!) 196/60   Pulse 87   Temp 98.2 F (36.8 C) (Oral)   Resp 16   Ht 1.702 m (5\' 7" )   Wt (!) 140.6 kg   SpO2 100%   BMI 48.55 kg/m  Physical Exam Vitals and nursing note reviewed.  Constitutional:      Appearance: Normal appearance. She is well-developed.  HENT:     Head: Atraumatic.     Mouth/Throat:     Mouth: Mucous membranes are moist.  Eyes:     General: No scleral icterus.    Conjunctiva/sclera: Conjunctivae normal.     Pupils: Pupils are equal, round, and reactive to light.  Neck:     Trachea: No tracheal deviation.  Cardiovascular:     Rate and Rhythm: Normal rate and regular rhythm.     Pulses: Normal pulses.     Heart sounds: Normal heart sounds. No murmur heard.    No friction rub. No gallop.  Pulmonary:     Effort: Pulmonary effort is normal. No respiratory distress.     Breath sounds: Normal breath sounds.  Abdominal:     General: Bowel sounds are normal. There is no distension.     Palpations: Abdomen is soft.     Tenderness: There is no abdominal tenderness. There  is no guarding.  Genitourinary:    Comments: No cva tenderness.  Musculoskeletal:        General: No swelling.     Cervical back: Normal range of motion and neck supple. No rigidity. No muscular tenderness.  Skin:    General: Skin is warm and dry.     Findings: No rash.  Neurological:     Mental Status: She is alert.     Comments: Alert, speech normal.   Psychiatric:        Mood and Affect: Mood normal.     ED Results / Procedures / Treatments   Labs (all labs ordered are listed, but only abnormal results are displayed) Results for orders placed or performed during the hospital encounter of 01/04/24  CBC   Collection Time: 01/04/24  1:58  PM  Result Value Ref Range   WBC 5.9 4.0 - 10.5 K/uL   RBC 4.64 3.87 - 5.11 MIL/uL   Hemoglobin 15.3 (H) 12.0 - 15.0 g/dL   HCT 45.4 (H) 09.8 - 11.9 %   MCV 99.8 80.0 - 100.0 fL   MCH 33.0 26.0 - 34.0 pg   MCHC 33.0 30.0 - 36.0 g/dL   RDW 14.7 82.9 - 56.2 %   Platelets 144 (L) 150 - 400 K/uL   nRBC 0.0 0.0 - 0.2 %  Basic metabolic panel   Collection Time: 01/04/24  1:58 PM  Result Value Ref Range   Sodium 137 135 - 145 mmol/L   Potassium 4.7 3.5 - 5.1 mmol/L   Chloride 101 98 - 111 mmol/L   CO2 24 22 - 32 mmol/L   Glucose, Bld 103 (H) 70 - 99 mg/dL   BUN 20 8 - 23 mg/dL   Creatinine, Ser 1.30 (H) 0.44 - 1.00 mg/dL   Calcium 9.9 8.9 - 86.5 mg/dL   GFR, Estimated 35 (L) >60 mL/min   Anion gap 12 5 - 15  D-dimer, quantitative   Collection Time: 01/04/24  1:58 PM  Result Value Ref Range   D-Dimer, Quant 0.84 (H) 0.00 - 0.50 ug/mL-FEU   CT Angio Chest PE W/Cm &/Or Wo Cm Result Date: 01/04/2024 CLINICAL DATA:  Pulmonary embolism (PE) suspected, low to intermediate prob, positive D-dimer. Back pain. EXAM: CT ANGIOGRAPHY CHEST WITH CONTRAST TECHNIQUE: Multidetector CT imaging of the chest was performed using the standard protocol during bolus administration of intravenous contrast. Multiplanar CT image reconstructions and MIPs were obtained to evaluate the vascular anatomy. RADIATION DOSE REDUCTION: This exam was performed according to the departmental dose-optimization program which includes automated exposure control, adjustment of the mA and/or kV according to patient size and/or use of iterative reconstruction technique. CONTRAST:  60mL OMNIPAQUE IOHEXOL 350 MG/ML SOLN COMPARISON:  Chest radiograph 11/08/2019 FINDINGS: Cardiovascular: Very limited evaluation for pulmonary embolism due to contrast preferentially being in the left heart and aorta. No large filling defects in the main pulmonary arteries. Normal caliber of the thoracic aorta. No evidence for an aortic dissection.  Atherosclerotic disease involving the thoracic aorta. Great vessels are patent. Coronary artery calcifications. Heart size is normal. No significant pericardial effusion. Celiac trunk and proximal SMA are patent. Mediastinum/Nodes: Mediastinal structures are unremarkable without lymph node enlargement. Question a 1.1 cm right thyroid nodule. Not clinically significant; no follow-up imaging recommended (ref: J Am Coll Radiol. 2015 Feb;12(2): 143-50). No axillary lymph node enlargement. Lungs/Pleura: Trachea and mainstem bronchi are patent. No pleural effusions. No focal airspace disease and no focal lung consolidation. Focal thickening along the right minor fissure  in the right middle lobe on image 65/6 that measures roughly 4 mm. Upper Abdomen: Low-density thickening in the left adrenal gland is suggestive for hyperplasia or adrenal adenoma. No acute abnormality in the visualized upper abdomen. Musculoskeletal: No acute bone abnormality. Review of the MIP images confirms the above findings. IMPRESSION: 1. Very limited evaluation for pulmonary embolism due to contrast preferentially being in the left heart and aorta. No large filling defects in the main pulmonary arteries. 2. No acute abnormality in the chest. 3. Coronary artery calcifications. 4. Aortic Atherosclerosis (ICD10-I70.0). 5. 4 mm nodule in the right lung. No follow-up needed if patient is low-risk. Non-contrast chest CT can be considered in 12 months if patient is high-risk. This recommendation follows the consensus statement: Guidelines for Management of Incidental Pulmonary Nodules Detected on CT Images: From the Fleischner Society 2017; Radiology 2017; 284:228-243. Electronically Signed   By: Richarda Overlie M.D.   On: 01/04/2024 16:41   DG Thoracic Spine W/Swimmers Result Date: 01/04/2024 CLINICAL DATA:  Back pain EXAM: THORACIC SPINE - 3 VIEWS COMPARISON:  None Available. FINDINGS: There is no evidence of thoracic spine fracture. Mildly exaggerated  thoracic kyphosis without traumatic listhesis. Mild disc height loss and endplate spurring of multiple levels. No other significant bone abnormalities are identified. IMPRESSION: Mild multilevel thoracic spondylosis. No acute findings. Electronically Signed   By: Duanne Guess D.O.   On: 01/04/2024 09:39     EKG None  Radiology CT Angio Chest PE W/Cm &/Or Wo Cm Result Date: 01/04/2024 CLINICAL DATA:  Pulmonary embolism (PE) suspected, low to intermediate prob, positive D-dimer. Back pain. EXAM: CT ANGIOGRAPHY CHEST WITH CONTRAST TECHNIQUE: Multidetector CT imaging of the chest was performed using the standard protocol during bolus administration of intravenous contrast. Multiplanar CT image reconstructions and MIPs were obtained to evaluate the vascular anatomy. RADIATION DOSE REDUCTION: This exam was performed according to the departmental dose-optimization program which includes automated exposure control, adjustment of the mA and/or kV according to patient size and/or use of iterative reconstruction technique. CONTRAST:  60mL OMNIPAQUE IOHEXOL 350 MG/ML SOLN COMPARISON:  Chest radiograph 11/08/2019 FINDINGS: Cardiovascular: Very limited evaluation for pulmonary embolism due to contrast preferentially being in the left heart and aorta. No large filling defects in the main pulmonary arteries. Normal caliber of the thoracic aorta. No evidence for an aortic dissection. Atherosclerotic disease involving the thoracic aorta. Great vessels are patent. Coronary artery calcifications. Heart size is normal. No significant pericardial effusion. Celiac trunk and proximal SMA are patent. Mediastinum/Nodes: Mediastinal structures are unremarkable without lymph node enlargement. Question a 1.1 cm right thyroid nodule. Not clinically significant; no follow-up imaging recommended (ref: J Am Coll Radiol. 2015 Feb;12(2): 143-50). No axillary lymph node enlargement. Lungs/Pleura: Trachea and mainstem bronchi are patent.  No pleural effusions. No focal airspace disease and no focal lung consolidation. Focal thickening along the right minor fissure in the right middle lobe on image 65/6 that measures roughly 4 mm. Upper Abdomen: Low-density thickening in the left adrenal gland is suggestive for hyperplasia or adrenal adenoma. No acute abnormality in the visualized upper abdomen. Musculoskeletal: No acute bone abnormality. Review of the MIP images confirms the above findings. IMPRESSION: 1. Very limited evaluation for pulmonary embolism due to contrast preferentially being in the left heart and aorta. No large filling defects in the main pulmonary arteries. 2. No acute abnormality in the chest. 3. Coronary artery calcifications. 4. Aortic Atherosclerosis (ICD10-I70.0). 5. 4 mm nodule in the right lung. No follow-up needed if patient is low-risk.  Non-contrast chest CT can be considered in 12 months if patient is high-risk. This recommendation follows the consensus statement: Guidelines for Management of Incidental Pulmonary Nodules Detected on CT Images: From the Fleischner Society 2017; Radiology 2017; 284:228-243. Electronically Signed   By: Richarda Overlie M.D.   On: 01/04/2024 16:41   DG Thoracic Spine W/Swimmers Result Date: 01/04/2024 CLINICAL DATA:  Back pain EXAM: THORACIC SPINE - 3 VIEWS COMPARISON:  None Available. FINDINGS: There is no evidence of thoracic spine fracture. Mildly exaggerated thoracic kyphosis without traumatic listhesis. Mild disc height loss and endplate spurring of multiple levels. No other significant bone abnormalities are identified. IMPRESSION: Mild multilevel thoracic spondylosis. No acute findings. Electronically Signed   By: Duanne Guess D.O.   On: 01/04/2024 09:39    Procedures Procedures    Medications Ordered in ED Medications  acetaminophen (TYLENOL) tablet 1,000 mg (1,000 mg Oral Given 01/04/24 0908)  ibuprofen (ADVIL) tablet 400 mg (400 mg Oral Given 01/04/24 0908)  metoprolol  tartrate (LOPRESSOR) tablet 25 mg (25 mg Oral Given 01/04/24 1221)  lisinopril (ZESTRIL) tablet 20 mg (20 mg Oral Given 01/04/24 1221)    And  hydrochlorothiazide (HYDRODIURIL) tablet 12.5 mg (12.5 mg Oral Given 01/04/24 1221)  lactated ringers bolus 1,000 mL (1,000 mLs Intravenous New Bag/Given 01/04/24 1517)  iohexol (OMNIPAQUE) 350 MG/ML injection 60 mL (60 mLs Intravenous Contrast Given 01/04/24 1552)    ED Course/ Medical Decision Making/ A&P                                 Medical Decision Making Problems Addressed: Acute right-sided thoracic back pain: acute illness or injury Elevated blood pressure reading: acute illness or injury Essential hypertension: chronic illness or injury with exacerbation, progression, or side effects of treatment that poses a threat to life or bodily functions Musculoskeletal strain: acute illness or injury Nodule of right lung: chronic illness or injury    Details: Need for f/u shared w pt.  Stage 3b chronic kidney disease (HCC): chronic illness or injury with exacerbation, progression, or side effects of treatment that poses a threat to life or bodily functions Thoracic nerve root impingement: acute illness or injury  Amount and/or Complexity of Data Reviewed External Data Reviewed: notes. Labs: ordered. Radiology: ordered and independent interpretation performed. Decision-making details documented in ED Course.  Risk OTC drugs. Prescription drug management.   Imaging ordered.   Reviewed nursing notes and prior charts for additional history.   Symptoms/exam appear most c/w msk pain, spams, and/or nerve impingement.   Acetaminophen po, ibuprofen po.  Xrays reviewed/interpreted by me - no fx. Degen changes.   Pt has not taken her bp meds yet today. Bp is high. No headache. No chest pain. Pt given a dose of her normal bp meds.   Labs reviewed/interpreted by me - cr 1.6 (pt with hx ckd, prior cr 1.4 last year). Wbc 6, hgb 15. Ddimer mildly  high. Given pain right back moving around to right lat chest, elev ddimer, will get cta.   CT reviewed/interpreted by me - no pna, pe or acute process. Small lung nodule and coronary calcifications, shared w pt, pcp f/u.   Bp is improved from prior. Pt indicates has adequate of her bp meds at home.   Rec pcp f/u for current symptoms as well as for bp which is high. Rx provided.   Return precautions provided.  Final Clinical Impression(s) / ED Diagnoses Final diagnoses:  Acute right-sided thoracic back pain  Musculoskeletal strain  Thoracic nerve root impingement  Elevated blood pressure reading  Essential hypertension  Stage 3b chronic kidney disease (HCC)    Rx / DC Orders ED Discharge Orders     None         Cathren Laine, MD 01/04/24 [phone]

## 2024-01-04 NOTE — ED Triage Notes (Addendum)
Pt c/o upper back pain that radiates to the right side of her back and sometimes to her right shoulder and into her neck x 3 weeks. Pt reports she saw Urgent Care recently and she had strep throat and they told her the symptoms could be coming from that.   Pt didn't take her BP medication this morning. BP 233/81 in triage.

## 2024-01-04 NOTE — Discharge Instructions (Addendum)
It was our pleasure to provide your ER care today - we hope that you feel better. Drink plenty of fluids/stay well hydrated.   Avoid bending at waist or heavy lifting > 20 lbs for the next week.  Try gentle massage and/or heat  therapy to sore area. Take acetaminophen as need for pain. You may also take robaxin as need for muscle pain/spasm - no driving when taking.   Today, your blood pressure is high. Your lab work shows chronic kidney disease. It is very important that you work closely with your doctor to optimize your blood pressure control. Also avoid taking non-steroidal anti-inflammatory type of pain medication such as ibuprofen/motrin, or naprosyn/aleve.  See attached info re: hypertension and chronic kidney disease.   Follow up with primary care doctor in the next 1-2 weeks if symptoms fail to improve/resolve.  Your blood pressure is high today, take meds as prescribed, follow heart health eating plan, and follow up with primary care doctor in the coming week for recheck.   Your ct scan was read as showing no acute process. Incidental note was made of small right lung nodule (4 mm) and coronary artery calcifications.  Follow up with primary care doctor, discuss above findings with them, and have them arrange follow up imaging in 6 months.   Return to ER if worse, new symptoms, fevers, severe/intractable pain, numbness/weakness, or other concern.

## 2024-11-08 ENCOUNTER — Emergency Department (HOSPITAL_COMMUNITY)

## 2024-11-08 ENCOUNTER — Emergency Department (HOSPITAL_COMMUNITY): Admission: EM | Admit: 2024-11-08 | Discharge: 2024-11-08 | Disposition: A | Discharge status: Home / Self Care

## 2024-11-08 ENCOUNTER — Other Ambulatory Visit: Payer: Self-pay

## 2024-11-08 DIAGNOSIS — J449 Chronic obstructive pulmonary disease, unspecified: Secondary | ICD-10-CM | POA: Insufficient documentation

## 2024-11-08 DIAGNOSIS — Y92410 Unspecified street and highway as the place of occurrence of the external cause: Secondary | ICD-10-CM | POA: Insufficient documentation

## 2024-11-08 DIAGNOSIS — Z79899 Other long term (current) drug therapy: Secondary | ICD-10-CM | POA: Insufficient documentation

## 2024-11-08 DIAGNOSIS — M25561 Pain in right knee: Secondary | ICD-10-CM | POA: Insufficient documentation

## 2024-11-08 DIAGNOSIS — M25552 Pain in left hip: Secondary | ICD-10-CM | POA: Insufficient documentation

## 2024-11-08 DIAGNOSIS — S20211A Contusion of right front wall of thorax, initial encounter: Secondary | ICD-10-CM | POA: Insufficient documentation

## 2024-11-08 DIAGNOSIS — M25551 Pain in right hip: Secondary | ICD-10-CM | POA: Insufficient documentation

## 2024-11-08 DIAGNOSIS — I10 Essential (primary) hypertension: Secondary | ICD-10-CM | POA: Insufficient documentation

## 2024-11-08 DIAGNOSIS — M25562 Pain in left knee: Secondary | ICD-10-CM | POA: Insufficient documentation

## 2024-11-08 LAB — CBC
HCT: 41.2 % (ref 36.0–46.0)
Hemoglobin: 13.9 g/dL (ref 12.0–15.0)
MCH: 33.3 pg (ref 26.0–34.0)
MCHC: 33.7 g/dL (ref 30.0–36.0)
MCV: 98.6 fL (ref 80.0–100.0)
Platelets: 137 K/uL — ABNORMAL LOW (ref 150–400)
RBC: 4.18 MIL/uL (ref 3.87–5.11)
RDW: 12.2 % (ref 11.5–15.5)
WBC: 9 K/uL (ref 4.0–10.5)
nRBC: 0 % (ref 0.0–0.2)

## 2024-11-08 LAB — URINALYSIS, ROUTINE W REFLEX MICROSCOPIC
Bilirubin Urine: NEGATIVE
Glucose, UA: NEGATIVE mg/dL
Ketones, ur: NEGATIVE mg/dL
Nitrite: NEGATIVE
Protein, ur: NEGATIVE mg/dL
Specific Gravity, Urine: 1.006 (ref 1.005–1.030)
pH: 6 (ref 5.0–8.0)

## 2024-11-08 LAB — COMPREHENSIVE METABOLIC PANEL WITH GFR
ALT: 17 U/L (ref 0–44)
AST: 24 U/L (ref 15–41)
Albumin: 3.3 g/dL — ABNORMAL LOW (ref 3.5–5.0)
Alkaline Phosphatase: 67 U/L (ref 38–126)
Anion gap: 10 (ref 5–15)
BUN: 17 mg/dL (ref 8–23)
CO2: 28 mmol/L (ref 22–32)
Calcium: 9.2 mg/dL (ref 8.9–10.3)
Chloride: 100 mmol/L (ref 98–111)
Creatinine, Ser: 1.5 mg/dL — ABNORMAL HIGH (ref 0.44–1.00)
GFR, Estimated: 38 mL/min — ABNORMAL LOW (ref >60)
Glucose, Bld: 104 mg/dL — ABNORMAL HIGH (ref 70–99)
Potassium: 4.2 mmol/L (ref 3.5–5.1)
Sodium: 138 mmol/L (ref 135–145)
Total Bilirubin: 0.7 mg/dL (ref 0.0–1.2)
Total Protein: 6.8 g/dL (ref 6.5–8.1)

## 2024-11-08 LAB — I-STAT CHEM 8, ED
BUN: 21 mg/dL (ref 8–23)
Calcium, Ion: 1.18 mmol/L (ref 1.15–1.40)
Chloride: 99 mmol/L (ref 98–111)
Creatinine, Ser: 1.5 mg/dL — ABNORMAL HIGH (ref 0.44–1.00)
Glucose, Bld: 101 mg/dL — ABNORMAL HIGH (ref 70–99)
HCT: 44 % (ref 36.0–46.0)
Hemoglobin: 15 g/dL (ref 12.0–15.0)
Potassium: 4.2 mmol/L (ref 3.5–5.1)
Sodium: 138 mmol/L (ref 135–145)
TCO2: 25 mmol/L (ref 22–32)

## 2024-11-08 LAB — ETHANOL: Alcohol, Ethyl (B): <15 mg/dL (ref <15)

## 2024-11-08 MED ORDER — KETOROLAC TROMETHAMINE 15 MG/ML IJ SOLN
15.0000 mg | Freq: Once | INTRAMUSCULAR | Status: AC
  Administered 2024-11-08: 15 mg via INTRAVENOUS
  Filled 2024-11-08: qty 1

## 2024-11-08 MED ORDER — MORPHINE SULFATE (PF) 2 MG/ML IV SOLN
2.0000 mg | Freq: Once | INTRAVENOUS | Status: AC
  Administered 2024-11-08: 2 mg via INTRAVENOUS
  Filled 2024-11-08: qty 1

## 2024-11-08 MED ORDER — IOHEXOL 350 MG/ML SOLN
50.0000 mL | Freq: Once | INTRAVENOUS | Status: AC | PRN
  Administered 2024-11-08: 50 mL via INTRAVENOUS

## 2024-11-08 MED ORDER — LIDOCAINE 5 % EX PTCH: 1.0000 | MEDICATED_PATCH | CUTANEOUS | 0 refills | Status: AC

## 2024-11-08 MED ORDER — LIDOCAINE 5 % EX PTCH
1.0000 | MEDICATED_PATCH | Freq: Once | CUTANEOUS | Status: DC
  Administered 2024-11-08: 1 via TRANSDERMAL
  Filled 2024-11-08: qty 1
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
## 2024-11-08 NOTE — Discharge Instructions (Addendum)
 May take on the counter medication such as Tylenol  alternate with ibuprofen  for pain relief.  May use lidocaine  patches for additional pain relief.  Follow-up with your primary doctor.  Return for fevers, chills, severe headache, facial droop, chest pain, shortness of breath, severe abdominal pain, inability eat or drink due to nausea vomiting, stop having bowel movements or any new or worsening symptoms that are concerning to you.

## 2024-11-08 NOTE — ED Notes (Signed)
 CCMD called to add pt to cardiac monitoring.
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
